# Patient Record
Sex: Female | Born: 1995 | Race: White | Hispanic: No | Marital: Married | Smoking: Never smoker
Health system: Southern US, Community
[De-identification: ages and names within clinical notes are randomized; demographics above are authoritative.]

## PROBLEM LIST (undated history)

## (undated) DIAGNOSIS — O10919 Unspecified pre-existing hypertension complicating pregnancy, unspecified trimester: Secondary | ICD-10-CM

## (undated) DIAGNOSIS — F419 Anxiety disorder, unspecified: Secondary | ICD-10-CM

## (undated) DIAGNOSIS — N83209 Unspecified ovarian cyst, unspecified side: Secondary | ICD-10-CM

## (undated) DIAGNOSIS — E282 Polycystic ovarian syndrome: Secondary | ICD-10-CM

## (undated) DIAGNOSIS — F32A Depression, unspecified: Secondary | ICD-10-CM

## (undated) DIAGNOSIS — F329 Major depressive disorder, single episode, unspecified: Secondary | ICD-10-CM

## (undated) DIAGNOSIS — I999 Unspecified disorder of circulatory system: Secondary | ICD-10-CM

## (undated) DIAGNOSIS — E7800 Pure hypercholesterolemia, unspecified: Secondary | ICD-10-CM

## (undated) HISTORY — DX: Unspecified ovarian cyst, unspecified side: N83.209

## (undated) HISTORY — DX: Depression, unspecified: F32.A

## (undated) HISTORY — PX: OTHER SURGICAL HISTORY: SHX169

## (undated) HISTORY — DX: Pure hypercholesterolemia, unspecified: E78.00

## (undated) HISTORY — DX: Polycystic ovarian syndrome: E28.2

## (undated) HISTORY — DX: Unspecified disorder of circulatory system: I99.9

## (undated) HISTORY — DX: Major depressive disorder, single episode, unspecified: F32.9

## (undated) HISTORY — DX: Anxiety disorder, unspecified: F41.9

## (undated) HISTORY — PX: TONSILLECTOMY: SHX5217

---
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
## 2015-02-22 ENCOUNTER — Other Ambulatory Visit: Payer: Self-pay

## 2015-02-22 DIAGNOSIS — E282 Polycystic ovarian syndrome: Secondary | ICD-10-CM

## 2015-02-22 NOTE — Telephone Encounter (Signed)
Pt left v/m; pt seen to establish on 12/19/2014; 12/19/14 office note said prozac refilled on 12/19/14 but do not see on med list where refilled. Pt said has been without med almost 3 months and [facility] [place]Please advise. Pt request cb.

## 2015-02-23 MED ORDER — FLUOXETINE HCL 40 MG PO CAPS: 40.0000 mg | ORAL_CAPSULE | Freq: Every day | ORAL | Status: DC

## 2015-02-23 MED ORDER — NORGESTIMATE-ETH ESTRADIOL 0.25-35 MG-MCG PO TABS: 1.0000 | ORAL_TABLET | Freq: Every day | ORAL | Status: DC

## 2015-03-07 ENCOUNTER — Telehealth: Payer: Self-pay | Admitting: Internal Medicine

## 2015-03-07 NOTE — Telephone Encounter (Signed)
Pt has appt on 03/08/15 at 1:45 pm with Nicki Reaperegina Baity NP.

## 2015-03-07 NOTE — Telephone Encounter (Signed)
Patient Name: Carmen Reid  DOB: 04/21/1996    Initial Comment Caller states she has been having headaches and feeling dizzy. Does not know her Drs name.   Nurse Assessment  Nurse: Scarlette ArStandifer, RN, Heather Date/Time (Eastern Time): 03/07/2015 2:19:39 PM  Confirm and document reason for call. If symptomatic, describe symptoms. ---Caller states that she gets headaches and dizziness when she bends over, when she stands up the headache and dizziness goes away. This started a couple months ago.  Has the patient traveled out of the country within the last 30 days? ---Not Applicable  Does the patient require triage? ---Yes  Related visit to physician within the last 2 weeks? ---No  Does the PT have any chronic conditions? (i.e. diabetes, asthma, etc.) ---Yes  List chronic conditions. ---PCOS,  Did the patient indicate they were pregnant? ---No     Guidelines    Guideline Title Affirmed Question Affirmed Notes  Dizziness [1] MILD dizziness (walking normally) AND [2] present > 3 days    Final Disposition User   See PCP When Office is Open (within 3 days) Standifer, RN, Herbert SetaHeather    Comments  Appt made tomorrow at 1:45 pm with Dr. Sampson SiBaity.   Disagree/Comply: Comply

## 2015-03-08 ENCOUNTER — Encounter: Payer: Self-pay | Admitting: Internal Medicine

## 2015-03-08 ENCOUNTER — Ambulatory Visit (INDEPENDENT_AMBULATORY_CARE_PROVIDER_SITE_OTHER): Payer: BLUE CROSS/BLUE SHIELD | Admitting: Internal Medicine

## 2015-03-08 VITALS — BP 120/84 | HR 84 | Temp 98.4°F | Wt 206.0 lb

## 2015-03-08 DIAGNOSIS — R42 Dizziness and giddiness: Secondary | ICD-10-CM

## 2015-03-08 DIAGNOSIS — H9201 Otalgia, right ear: Secondary | ICD-10-CM | POA: Diagnosis not present

## 2015-03-08 DIAGNOSIS — R51 Headache: Secondary | ICD-10-CM

## 2015-03-08 DIAGNOSIS — R519 Headache, unspecified: Secondary | ICD-10-CM

## 2015-03-08 NOTE — Progress Notes (Signed)
Subjective:    Patient ID: Carmen Reid, female    DOB: Aug 14, 1996, 19 y.o.   MRN: [id number]  HPI  Pt presents to the clinic today with c/o headache and dizziness. She reports this started a few months ago. Her symptoms are worse when she bends forward, better when she stands up. She has had some associated right ear pain and runny. She reports her ear feels achy and like there is a lot of pressure. She has not noticed decreased hearing but she does feel like she hears through a tunnel in her right ear. She denies fever, chills or body aches. She has not taken anything OTC. She has no history of allergies. She has not had sick contacts.  Review of Systems      Past Medical History  Diagnosis Date  . PCOS (polycystic ovarian syndrome)   . Ovarian cyst   . Depression   . Anxiety   . 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 High cholesterol     Not on medication    Current Outpatient Prescriptions  Medication Sig Dispense Refill  . FLUoxetine (PROZAC) 40 MG capsule Take 1 capsule (40 mg total) by mouth daily. 30 capsule 3  . metFORMIN (GLUCOPHAGE) 500 MG tablet TAKE 1 TABLET (500 MG TOTAL)  BY MOUTH 2 (TWO) TIMES DAILY WITH AMEAL. (Patient taking differently: TAKE 1 TABLET (500 MG TOTAL)  BY MOUTH  DAILY WITH AMEAL.) 60 tablet 10  . norgestimate-ethinyl estradiol (ORTHO-CYCLEN,SPRINTEC,PREVIFEM) 0.25-35 MG-MCG tablet Take 1 tablet by mouth daily. 1 Package 11   No current facility-administered medications for this visit.    Allergies  Allergen Reactions  . Warfarin And Related Dermatitis    Family History  Problem Relation Age of Onset  . Cancer Maternal Uncle     liver cancer  . Kidney disease Maternal Uncle   . Cancer Maternal Grandmother 51    breast cancer/skin cancer  . Heart disease Maternal Grandmother   . Diabetes Maternal Grandmother   . Kidney disease Maternal Grandmother   . Hyperlipidemia Maternal  Grandmother   . Hypertension Maternal Grandmother   . Arthritis Maternal Grandmother   . Mental illness Maternal Grandmother   . Heart disease Maternal Grandfather   . Cancer Maternal Grandfather 40    prostate cancer  . Diabetes Maternal Grandfather   . Hyperlipidemia Maternal Grandfather   . Mental illness Maternal Grandfather   . Mental illness Paternal Grandmother   . Hypertension Paternal Grandmother   . Hypertension Mother   . Mental illness Mother   . Arthritis Father   . Hyperlipidemia Father   . Heart disease Father   . Stroke Maternal Aunt   . Heart disease Paternal Uncle   . Cancer Maternal Uncle     Colon    History   Social History  . Marital Status: Single    Spouse Name: N/A  . Number of Children: N/A  . Years of Education: N/A   Occupational History  . Not on file.   Social History Main Topics  . Smoking status: Never Smoker   . Smokeless tobacco: Never Used  . Alcohol Use: No  . Drug Use: No  . Sexual Activity: Yes    Birth Control/ Protection: Pill   Other Topics Concern  . Not on file   Social History Narrative     Constitutional: Pt reports headache. Denies fever, malaise, fatigue, or abrupt weight changes.  HEENT: Pt reports right ear pain, runny nose. Denies eye pain, eye redness, ringing in the ears, wax buildup, nasal congestion, bloody nose, or sore throat. Respiratory: Denies difficulty breathing, shortness of breath, cough or sputum production.   Cardiovascular: Denies chest pain, chest tightness, palpitations or swelling in the hands or feet.  Skin: Denies redness, rashes, lesions or ulcercations.  Neurological: Pt reports dizziness. Denies difficulty with memory, difficulty with speech or problems with balance and coordination.   No other specific complaints in a complete review of systems (except as listed in HPI above).  Objective:   Physical Exam  BP 120/84 mmHg  Pulse 84  Temp(Src) 98.4 F (36.9 C) (Oral)  Wt 206 lb  (93.441 kg)  SpO2 99%  LMP 02/13/2015 Wt Readings from Last 3 Encounters:  03/08/15 206 lb (93.441 kg) (98 %*, Z = 2.03)  12/19/14 209 lb (94.802 kg) (98 %*, Z = 2.07)  07/01/14 199 lb (90.266 kg) (97 %*, Z = 1.95)   * Growth percentiles are based on CDC 2-20 Years data.    General: Appears her stated age, obese in NAD. Skin: Warm, dry and intact. No rashes, lesions or ulcerations noted. HEENT: Head: normal shape and size, no sinus tenderness; Eyes: sclera white, no icterus, conjunctiva pink, pupils marginally unequal R>L, and EOMs intact; Ears: Tm's gray and intact, normal light reflex; Throat/Mouth: Teeth present, mucosa pink and moist, no exudate, lesions or ulcerations noted.  Neck: No adenopathy noted. Cardiovascular: Normal rate and rhythm. S1,S2 noted.  No murmur, rubs or gallops noted.  Pulmonary/Chest: Normal effort and positive vesicular breath sounds. No respiratory distress. No wheezes, rales or ronchi noted.   Neurological: Alert and oriented. Coordination normal. Weber lateralizes to left ear. Normal Rinne.  BMET    Component Value Date/Time   NA 138 12/19/2014 1549   NA 133 06/21/2013 1436   K 4.3 12/19/2014 1549   K 3.3 06/21/2013 1436   CL 105 12/19/2014 1549   CL 100 06/21/2013 1436   CO2 24 12/19/2014 1549   CO2 24 06/21/2013 1436   GLUCOSE 87 12/19/2014 1549   GLUCOSE 108* 06/21/2013 1436   BUN 10 12/19/2014 1549   BUN 5* 06/21/2013 1436   CREATININE 0.74 12/19/2014 1549   CREATININE 0.76 06/21/2013 1436   CALCIUM 9.6 12/19/2014 1549   CALCIUM 9.8 06/21/2013 1436    Lipid Panel     Component Value Date/Time   CHOL 257* 12/19/2014 1549   TRIG 309.0* 12/19/2014 1549   HDL 44.10 12/19/2014 1549   CHOLHDL 6 12/19/2014 1549   VLDL 61.8* 12/19/2014 1549    CBC    Component Value Date/Time   WBC 7.8 12/19/2014 1549   WBC 16.7* 06/21/2013 1436   RBC 4.63 12/19/2014 1549   RBC 4.92 06/21/2013 1436   HGB 13.9 12/19/2014 1549   HGB 15.1 06/21/2013  1436   HCT 40.4 12/19/2014 1549   HCT 42.6 06/21/2013 1436   PLT 370.0 12/19/2014 1549   PLT 275 06/21/2013 1436   MCV 87.2 12/19/2014 1549   MCV 87 06/21/2013 1436   MCH 30.6 06/21/2013 1436   MCHC 34.5 12/19/2014 1549   MCHC 35.4 06/21/2013 1436   RDW 12.6 12/19/2014 1549   RDW 12.8 06/21/2013 1436    Hgb A1C Lab Results  Component Value Date   HGBA1C 5.2 12/19/2014         Assessment & Plan:   Headache, dizziness,, ear pain:  ? ETD Will start Flonase 1 spray daily  x 2-3 weeks Orthostatics, she did have a drop from sitting to standing, but asymptomatic- push more fluids She declines blood work today  RTC as needed or if symptoms persist or worsen

## 2015-03-08 NOTE — Patient Instructions (Addendum)
  Flonase 1 x day for the next 2-3 weeksBarotitis Media Barotitis media is inflammation of your middle ear. This occurs when the auditory tube (eustachian tube) leading from the back of your nose (nasopharynx) to your eardrum is blocked. This blockage may result from a cold, environmental allergies, or an upper respiratory infection. Unresolved barotitis media may lead to damage or hearing loss (barotrauma), which may become permanent. HOME CARE INSTRUCTIONS   Use medicines as recommended by your health care provider. Over-the-counter medicines will help unblock the canal and can help during times of air travel.  Do not put anything into your ears to clean or unplug them. Eardrops will not be helpful.  Do not swim, dive, or fly until your health care provider says it is all right to do so. If these activities are necessary, chewing gum with frequent, forceful swallowing may help. It is also helpful to hold your nose and gently blow to pop your ears for equalizing pressure changes. This forces air into the eustachian tube.  Only take over-the-counter or prescription medicines for pain, discomfort, or fever as directed by your health care provider.  A decongestant may be helpful in decongesting the middle ear and make pressure equalization easier. SEEK MEDICAL CARE IF:  You experience a serious form of dizziness in which you feel as if the room is spinning and you feel nauseated (vertigo).  Your symptoms only involve one ear. SEEK IMMEDIATE MEDICAL CARE IF:   You develop a severe headache, dizziness, or severe ear pain.  You have bloody or pus-like drainage from your ears.  You develop a fever.  Your problems do not improve or become worse. MAKE SURE YOU:   Understand these instructions.  Will watch your condition.  Will get help right away if you are not doing well or get worse. Document Released: 08/02/2000 Document Revised: 05/26/2013 Document Reviewed: 03/02/2013 [facility]  Patient Information 2015 [place], [place]. This information is not intended to replace advice given to you by your health care provider. Make sure you discuss any questions you have with your health care provider.

## 2015-03-08 NOTE — Progress Notes (Signed)
Pre visit review using our clinic review tool, if applicable. No additional management support is needed unless otherwise documented below in the visit note. 

## 2015-03-13 ENCOUNTER — Telehealth: Payer: Self-pay

## 2015-03-13 NOTE — Telephone Encounter (Signed)
Pt was seen 03/08/15 and nasal spray was not at pharmacy; advised pt flonase to use 1 spray daily for 2-3 weeks OTC and if not better pt will cb. Pt voiced understanding.

## 2015-03-21 ENCOUNTER — Other Ambulatory Visit: Payer: Self-pay

## 2015-03-21 MED ORDER — METFORMIN HCL 500 MG PO TABS: ORAL_TABLET | ORAL | Status: DC

## 2015-03-21 NOTE — Telephone Encounter (Signed)
Pt left v/m requesting refill metformin to [facility]. On 12/19/14 visit noted pt not taking metformin. 12/19/14 lab result noted pt does not have diabetes. Spoke with pt; pt said taking metformin for PCOS, pt said she is supposed to be taking metformin 500 mg 1 tab bid but pt is only taking one tab daily. This is reflected on med list.

## 2015-04-14 ENCOUNTER — Telehealth: Payer: Self-pay

## 2015-04-14 ENCOUNTER — Encounter: Payer: Self-pay | Admitting: Internal Medicine

## 2015-04-14 ENCOUNTER — Ambulatory Visit
Admission: RE | Admit: 2015-04-14 | Discharge: 2015-04-14 | Disposition: A | Discharge status: Home / Self Care | Payer: BLUE CROSS/BLUE SHIELD | Source: Ambulatory Visit | Attending: Internal Medicine | Admitting: Internal Medicine

## 2015-04-14 ENCOUNTER — Ambulatory Visit: Payer: Self-pay | Admitting: Internal Medicine

## 2015-04-14 ENCOUNTER — Telehealth: Payer: Self-pay | Admitting: Internal Medicine

## 2015-04-14 ENCOUNTER — Other Ambulatory Visit: Payer: Self-pay | Admitting: Internal Medicine

## 2015-04-14 ENCOUNTER — Ambulatory Visit (INDEPENDENT_AMBULATORY_CARE_PROVIDER_SITE_OTHER): Payer: BLUE CROSS/BLUE SHIELD | Admitting: Internal Medicine

## 2015-04-14 VITALS — BP 126/78 | HR 78 | Temp 98.2°F | Wt 207.0 lb

## 2015-04-14 DIAGNOSIS — R519 Headache, unspecified: Secondary | ICD-10-CM

## 2015-04-14 DIAGNOSIS — R51 Headache: Secondary | ICD-10-CM | POA: Diagnosis present

## 2015-04-14 DIAGNOSIS — G4485 Primary stabbing headache: Secondary | ICD-10-CM

## 2015-04-14 MED ORDER — IOHEXOL 350 MG/ML SOLN
80.0000 mL | Freq: Once | INTRAVENOUS | Status: AC | PRN
  Administered 2015-04-14: 80 mL via INTRAVENOUS

## 2015-04-14 NOTE — Telephone Encounter (Signed)
Pt has appt with Nicki Reaper NP 04/14/15 at 2:15pm.

## 2015-04-14 NOTE — Telephone Encounter (Signed)
Patient Name: Carmen Reid  DOB: 16-Jun-1996    Initial Comment Caller states she is having sharp pains in her head and blurred vision. Doctor unknown.   Nurse Assessment  Nurse: Sherilyn Cooter, RN, Thurmond Butts Date/Time Lamount Cohen Time): 04/14/2015 1:12:15 PM  Confirm and document reason for call. If symptomatic, describe symptoms. ---Caller states that she has sharp pains in her head with blurry vision which began about 2 weeks ago. She rates her pain as 5 on 0-10 scale currently. Denies the blurry vision at present. It only happens when she has a sharp pain. Denies vomiting. She states that when she bends over, her vision occasionally go black on her. Denies weakness and numbness.  Has the patient traveled out of the country within the last 30 days? ---No  Does the patient require triage? ---Yes  Related visit to physician within the last 2 weeks? ---No  Does the PT have any chronic conditions? (i.e. diabetes, asthma, etc.) ---Yes  List chronic conditions. ---Hypercholesterolemia  Did the patient indicate they were pregnant? ---No     Guidelines    Guideline Title Affirmed Question Affirmed Notes  Headache [1] MODERATE headache (e.g., interferes with normal activities) AND [2] present > 24 hours AND [3] unexplained (Exceptions: analgesics not tried, typical migraine, or headache part of viral illness)    Final Disposition User   See Physician within 24 Hours Sherilyn Cooter, RN, Thurmond Butts    Comments  Appointment scheduled for today at 3:00pm with Nicki Reaper NP   Disagree/Comply: Danella Maiers

## 2015-04-14 NOTE — Telephone Encounter (Signed)
Per verbal order---from Dewitt Rota for pt to go home and CT scan was not urgent---will be referring her out and listen out for call

## 2015-04-14 NOTE — Progress Notes (Signed)
Pre visit review using our clinic review tool, if applicable. No additional management support is needed unless otherwise documented below in the visit note. 

## 2015-04-14 NOTE — Patient Instructions (Signed)

## 2015-04-14 NOTE — Progress Notes (Signed)
Subjective:    Patient ID: Carmen Reid, female    DOB: [birth year], 19 y.o.   MRN: [id number]  HPI  Pt presents to the clinic today with c/o headaches and right ear pain. She reports this hasbeen intermittent over the last 2 weeks. She will get a sharp, stabbing pain in her right temple or back of right head that is associated with blurry vision. It comes and goes and last only about 1 minute. She also reports when she bends forward, everything "goes black". She denies nausea, vomiting, dizziness, chest pain, shortness of breath or numbness and tingling. Nothing makes them better or worse. She was seen for the same in 02/2015. She was started on Flonase for possible ETD. The Flonase did not help. She denies any changes in diet, medication or activity level. She has not had a eye exam in > 1 year. She tells me that her mom had a brain tumor and her aunt had a a brain aneurysm.  Review of Systems      Past Medical History  Diagnosis Date  . PCOS (polycystic ovarian syndrome)   . Ovarian cyst   . Depression   . Anxiety   . 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 High cholesterol     Not on medication    Current Outpatient Prescriptions  Medication Sig Dispense Refill  . FLUoxetine (PROZAC) 40 MG capsule Take 1 capsule (40 mg total) by mouth daily. 30 capsule 3  . metFORMIN (GLUCOPHAGE) 500 MG tablet TAKE 1 TABLET (500 MG TOTAL)  BY MOUTH  DAILY WITH AMEAL. 30 tablet 5  . norgestimate-ethinyl estradiol (ORTHO-CYCLEN,SPRINTEC,PREVIFEM) 0.25-35 MG-MCG tablet Take 1 tablet by mouth daily. 1 Package 11   No current facility-administered medications for this visit.    Allergies  Allergen Reactions  . Warfarin And Related Dermatitis    Family History  Problem Relation Age of Onset  . Cancer Maternal Uncle     liver cancer  . Kidney disease Maternal Uncle   . Cancer Maternal Grandmother 52    breast cancer/skin cancer  . Heart  disease Maternal Grandmother   . Diabetes Maternal Grandmother   . Kidney disease Maternal Grandmother   . Hyperlipidemia Maternal Grandmother   . Hypertension Maternal Grandmother   . Arthritis Maternal Grandmother   . Mental illness Maternal Grandmother   . Heart disease Maternal Grandfather   . Cancer Maternal Grandfather 68    prostate cancer  . Diabetes Maternal Grandfather   . Hyperlipidemia Maternal Grandfather   . Mental illness Maternal Grandfather   . Mental illness Paternal Grandmother   . Hypertension Paternal Grandmother   . Hypertension Mother   . Mental illness Mother   . Arthritis Father   . Hyperlipidemia Father   . Heart disease Father   . Stroke Maternal Aunt   . Heart disease Paternal Uncle   . Cancer Maternal Uncle     Colon    Social History   Social History  . Marital Status: Single    Spouse Name: N/A  . Number of Children: N/A  . Years of Education: N/A   Occupational History  . Not on file.   Social History Main Topics  . Smoking status: Never Smoker   . Smokeless tobacco: Never Used  . Alcohol Use: No  . Drug Use: No  . Sexual Activity: Yes    Birth Control/ Protection: Pill   Other Topics  Concern  . Not on file   Social History Narrative     Constitutional: Pt reports headache. Denies fever, malaise, fatigue, or abrupt weight changes.  HEENT: Denies eye pain, eye redness, ear pain, ringing in the ears, wax buildup, runny nose, nasal congestion, bloody nose, or sore throat. Respiratory: Denies difficulty breathing, shortness of breath, cough or sputum production.   Cardiovascular: Denies chest pain, chest tightness, palpitations or swelling in the hands or feet.  Neurological: Denies dizziness, difficulty with memory, difficulty with speech or problems with balance and coordination.   No other specific complaints in a complete review of systems (except as listed in HPI above).  Objective:   Physical Exam   BP 126/78 mmHg   Pulse 78  Temp(Src) 98.2 F (36.8 C) (Oral)  Wt 207 lb (93.895 kg)  SpO2 98%  LMP 04/12/2015 Wt Readings from Last 3 Encounters:  04/14/15 207 lb (93.895 kg) (98 %*, Z = 2.04)  03/08/15 206 lb (93.441 kg) (98 %*, Z = 2.03)  12/19/14 209 lb (94.802 kg) (98 %*, Z = 2.07)   * Growth percentiles are based on CDC 2-20 Years data.    General: Appears her stated age, obese in NAD. HEENT: Head: normal shape and size; Eyes: sclera white, no icterus, conjunctiva pink, PERRLA and EOMs intact;  Cardiovascular: Normal rate and rhythm. S1,S2 noted.  No murmur, rubs or gallops noted.  Pulmonary/Chest: Normal effort and positive vesicular breath sounds. No respiratory distress. No wheezes, rales or ronchi noted.  Neurological: Alert and oriented. Coordination normal.    BMET    Component Value Date/Time   NA 138 12/19/2014 1549   NA 133 06/21/2013 1436   K 4.3 12/19/2014 1549   K 3.3 06/21/2013 1436   CL 105 12/19/2014 1549   CL 100 06/21/2013 1436   CO2 24 12/19/2014 1549   CO2 24 06/21/2013 1436   GLUCOSE 87 12/19/2014 1549   GLUCOSE 108* 06/21/2013 1436   BUN 10 12/19/2014 1549   BUN 5* 06/21/2013 1436   CREATININE 0.74 12/19/2014 1549   CREATININE 0.76 06/21/2013 1436   CALCIUM 9.6 12/19/2014 1549   CALCIUM 9.8 06/21/2013 1436    Lipid Panel     Component Value Date/Time   CHOL 257* 12/19/2014 1549   TRIG 309.0* 12/19/2014 1549   HDL 44.10 12/19/2014 1549   CHOLHDL 6 12/19/2014 1549   VLDL 61.8* 12/19/2014 1549    CBC    Component Value Date/Time   WBC 7.8 12/19/2014 1549   WBC 16.7* 06/21/2013 1436   RBC 4.63 12/19/2014 1549   RBC 4.92 06/21/2013 1436   HGB 13.9 12/19/2014 1549   HGB 15.1 06/21/2013 1436   HCT 40.4 12/19/2014 1549   HCT 42.6 06/21/2013 1436   PLT 370.0 12/19/2014 1549   PLT 275 06/21/2013 1436   MCV 87.2 12/19/2014 1549   MCV 87 06/21/2013 1436   MCH 30.6 06/21/2013 1436   MCHC 34.5 12/19/2014 1549   MCHC 35.4 06/21/2013 1436   RDW 12.6  12/19/2014 1549   RDW 12.8 06/21/2013 1436    Hgb A1C Lab Results  Component Value Date   HGBA1C 5.2 12/19/2014        Assessment & Plan:   Headache:  Will obtain CT angio head to r/o aneurysm If CT head normal, these headaches are likely benign Advised her at some point she should get her eyes checked She had labs 12/2014- reviewed  Will follow up after CT head, to ER if worse

## 2015-04-15 ENCOUNTER — Telehealth: Payer: Self-pay | Admitting: Family Medicine

## 2015-04-15 NOTE — Telephone Encounter (Signed)
On call note.  Patient was worried about CT.  I asked RN fielding the call to notify pt that nothing was emergent/urgent on the CT.  It appears she has referral to neuro because of the headaches, not because of a sig abnormality on the CT.  She has incidental findings on CT, will ask Baity to talk to patient about that.  Routed to [place].

## 2015-04-17 ENCOUNTER — Telehealth: Payer: Self-pay | Admitting: *Deleted

## 2015-04-17 ENCOUNTER — Telehealth: Payer: Self-pay

## 2015-04-17 NOTE — Telephone Encounter (Signed)
PLEASE NOTE: All timestamps contained within this report are represented as [place] Standard Time. CONFIDENTIALTY NOTICE: This fax transmission is intended only for the addressee. It contains information that is legally privileged, confidential or otherwise protected from use or disclosure. If you are not the intended recipient, you are strictly prohibited from reviewing, disclosing, copying using or disseminating any of this information or taking any action in reliance on or regarding this information. If you have received this fax in error, please notify us immediately by telephone so that we can arrange for its return to [place]. Phone: [phone], Toll-Free: [phone], Fax: [phone] Page: 1 of 2 Call Id: [id number] [facility] Night - Client TELEPHONE ADVICE RECORD [facility] Patient Name: Carmen Reid Gender: Female DOB: [birth year] Age: 19 Y 6 M 14 D Return Phone Number: [phone] (Primary) Address: City/State/Zip: [place] Client [facility] Night - Client Client Site [facility] [place] - Night Physician Nicki Reaper Contact Type Call Call Type Triage / Clinical Relationship To Patient Self Return Phone Number [phone] (Primary) Chief Complaint Lab Result Initial Comment Would like lab results. Nurse Assessment Nurse: Izola Price, RN, Cala Bradford Date/Time Lamount Cohen Time): 04/15/2015 11:24:02 AM Confirm and document reason for call. If symptomatic, describe symptoms. ---caller states she Would like lab results. CT scan yesterday at hospital. Sharp pain, dizziness, headaches for 5 days. Light makes headaches worse. Pt states that nurse called yesterday and left a message that it wasn't an emergency but pt does need to call the office and talk with Dr. as soon as she can. Pt still having symptoms and is very worried. She would like nurse to call MD to see if they can pull up the results. Has the patient  traveled out of the country within the last 30 days? ---Not Applicable Does the patient require triage? ---Declined Triage Please document clinical information provided and list any resource used. ---Nurse will page MD on call to see if any information can be retrieved. Guidelines Guideline Title Affirmed Question Affirmed Notes Nurse Date/Time (Eastern Time) Disp. Time Lamount Cohen Time) Disposition Final User 04/15/2015 11:08:29 AM Attempt made - message left Rayburn Felt 04/15/2015 11:32:45 AM Called On-Call Provider Izola Price, RN, [facility] 04/15/2015 11:40:02 AM Clinical Call Izola Price, RN, [facility] 04/15/2015 11:41:06 AM Send To RN Personal Izola Price, RN, [facility] Bradford 04/15/2015 1:39:50 PM Clinical Call Yes Izola Price, RN, Cal[facility] Bradford After Care Instructions Given Call Event Type User Date / Time Description Comments User: Karen Chafe, RN Date/Time Lamount Cohen Time): 04/15/2015 1:39:36 PM PLEASE NOTE: All timestamps contained within this report are represented as [place] Standard Time. CONFIDENTIALTY NOTICE: This fax transmission is intended only for the addressee. It contains information that is legally privileged, confidential or otherwise protected from use or disclosure. If you are not the intended recipient, you are strictly prohibited from reviewing, disclosing, copying using or disseminating any of this information or taking any action in reliance on or regarding this information. If you have received this fax in error, please notify us immediately by telephone so that we can arrange for its return to [place]. Phone: [phone], Toll-Free: [phone], Fax: [phone] Page: 2 of 2 Call Id: [id number] Comments Nurse had called and left message for pt to call office when she got a break. Pt returned call and nurse explained that the Dr. stated that the CT looked ok as far as anything that could be causing the headaches. No tumors or anything like that but that pt did need to call the office  on Monday to  see if they could get an appt set up for pt to see a neurologist since the Ct didn't show any reason for the headaches. Pt got very upset stating that the person who called yesterday and left a message shouldn't have said that everything was ok but for pt to call office as soon as she can. this made pt feels as if something was wrong. Nurse explained to her that the nurse (in the office) probably meant that she needed to call in to see if an appt could be scheduled for the neurologist. Pt was just very upset about the way that the office nurse stated the message and that this is what made her worry so much that something was wrong. Nurse ([facility]) tried to reassure pt that the CT as far as the cause of the headaches, was ok and non emergent and that the nurse in the office was just trying to make sure that she called in sooner rather than later to get an appt scheduled for neuro. so they could try to find out what was causing the headaches. Pt started yelling that she shouldn't have said what she did and hung up on nurse. Paging DoctorName Phone DateTime Result/Outcome Message Type Notes Raechel Ache [phone] 04/15/2015 11:32:45 AM Called On Call Provider - Reached Doctor Paged Raechel Ache 04/15/2015 11:39:45 AM Spoke with On Call - General Message Result Dr. Para March got results of CT scan (nothing emergent but since nothing was found to be causing the headaches, pt needs to see neurologist). Dr. Para March will inform pt Dr. (Dr. Sampson Si) that pt would like a call from her on Monday to discuss this as she is a little worried. Nurse notified pt to call in when on break (pt is at work) to discuss results.

## 2015-04-17 NOTE — Telephone Encounter (Signed)
Mel-  Call pt. I sent her a mychart message over the weekend about her CT but I guess she didn't get it. Her head CT did not show anything acute, no tumor or aneurysm but I still want to refer her to neurology for evaluation of her ongoing headaches. I have put in the referral and someone should call her with an appt.

## 2015-04-17 NOTE — Telephone Encounter (Signed)
There is a result note about this

## 2015-04-17 NOTE — Telephone Encounter (Signed)
PLEASE NOTE: All timestamps contained within this report are represented as [place] Standard Time. CONFIDENTIALTY NOTICE: This fax transmission is intended only for the addressee. It contains information that is legally privileged, confidential or otherwise protected from use or disclosure. If you are not the intended recipient, you are strictly prohibited from reviewing, disclosing, copying using or disseminating any of this information or taking any action in reliance on or regarding this information. If you have received this fax in error, please notify us immediately by telephone so that we can arrange for its return to [place]. Phone: [phone], Toll-Free: [phone], Fax: [phone] Page: 1 of 2 Call Id: [id number] [facility] Night - Client TELEPHONE ADVICE RECORD [facility] Medical Call Center Patient Name: Carmen Reid Gender: Female DOB: Feb 01, 1996 Age: 19 Y 6 M 14 D Return Phone Number: [phone] (Primary) Address: City/State/Zip: [place] Client [facility] Night - Client Client Site [facility] [place] - Night Physician Nicki Reaper Contact Type Call Call Type Triage / Clinical Relationship To Patient Self Return Phone Number [phone] (Primary) Chief Complaint Lab Result Initial Comment Would like lab results. Nurse Assessment Nurse: Izola Price, RN, Cala Bradford Date/Time Lamount Cohen Time): 04/15/2015 11:24:02 AM Confirm and document reason for call. If symptomatic, describe symptoms. ---caller states she Would like lab results. CT scan yesterday at hospital. Sharp pain, dizziness, headaches for 5 days. Light makes headaches worse. Pt states that nurse called yesterday and left a message that it wasn't an emergency but pt does need to call the office and talk with Dr. as soon as she can. Pt still having symptoms and is very worried. She would like nurse to call MD to see if they can pull up the results. Has the patient  traveled out of the country within the last 30 days? ---Not Applicable Does the patient require triage? ---Declined Triage Please document clinical information provided and list any resource used. ---Nurse will page MD on call to see if any information can be retrieved. Guidelines Guideline Title Affirmed Question Affirmed Notes Nurse Date/Time (Eastern Time) Disp. Time Lamount Cohen Time) Disposition Final User 04/15/2015 11:08:29 AM Attempt made - message left Rayburn Felt 04/15/2015 11:32:45 AM Called On-Call Provider Izola Price, RN, Cala Bradford 04/15/2015 11:40:02 AM Clinical Call Yes Izola Price, RN, Cala Bradford After Care Instructions Given Call Event Type User Date / Time Description PLEASE NOTE: All timestamps contained within this report are represented as [place] Standard Time. CONFIDENTIALTY NOTICE: This fax transmission is intended only for the addressee. It contains information that is legally privileged, confidential or otherwise protected from use or disclosure. If you are not the intended recipient, you are strictly prohibited from reviewing, disclosing, copying using or disseminating any of this information or taking any action in reliance on or regarding this information. If you have received this fax in error, please notify us immediately by telephone so that we can arrange for its return to [place]. Phone: [phone], Toll-Free: [phone], Fax: [phone] Page: 2 of 2 Call Id: [id number] Paging DoctorName Phone DateTime Result/Outcome Message Type Notes Raechel Ache [id number] 04/15/2015 11:32:45 AM Called On Call Provider - Reached Doctor Paged Raechel Ache 04/15/2015 11:39:45 AM Spoke with On Call - General Message Result Dr. Para March got results of CT scan (nothing emergent but since nothing was found to be causing the headaches, pt needs to see neurologist). Dr. Para March will inform pt Dr. (Dr. Sampson Si) that pt would like a call from her on Monday to discuss this as she is  a little  worried. Nurse notified pt to call in when on break (pt is at work) to discuss results.

## 2015-04-17 NOTE — Telephone Encounter (Signed)
Yes, Ibuprofen or Tylenol is fine.

## 2015-04-17 NOTE — Telephone Encounter (Signed)
Patient left a voicemail stating that she wants to know if she can take ibuprofen for her headaches? Patient stated that she will be at work and it is okay to leave a voicemail.

## 2015-04-17 NOTE — Telephone Encounter (Signed)
Pt is aware as instructed 

## 2015-04-17 NOTE — Telephone Encounter (Signed)
lmovm

## 2015-04-17 NOTE — Telephone Encounter (Signed)
See 04/15/15 phone note.

## 2015-05-05 ENCOUNTER — Other Ambulatory Visit: Payer: Self-pay | Admitting: Neurology

## 2015-05-05 DIAGNOSIS — R519 Headache, unspecified: Secondary | ICD-10-CM

## 2015-05-05 DIAGNOSIS — R51 Headache: Principal | ICD-10-CM

## 2015-05-05 DIAGNOSIS — R930 Abnormal findings on diagnostic imaging of skull and head, not elsewhere classified: Secondary | ICD-10-CM

## 2015-05-15 ENCOUNTER — Ambulatory Visit
Admission: RE | Admit: 2015-05-15 | Discharge: 2015-05-15 | Disposition: A | Discharge status: Home / Self Care | Payer: BLUE CROSS/BLUE SHIELD | Source: Ambulatory Visit | Attending: Neurology | Admitting: Neurology

## 2015-05-15 ENCOUNTER — Other Ambulatory Visit: Payer: Self-pay | Admitting: Neurology

## 2015-05-15 DIAGNOSIS — R930 Abnormal findings on diagnostic imaging of skull and head, not elsewhere classified: Secondary | ICD-10-CM

## 2015-05-15 DIAGNOSIS — R519 Headache, unspecified: Secondary | ICD-10-CM

## 2015-05-15 DIAGNOSIS — G932 Benign intracranial hypertension: Secondary | ICD-10-CM

## 2015-05-15 DIAGNOSIS — R51 Headache: Principal | ICD-10-CM

## 2015-05-15 MED ORDER — GADOBENATE DIMEGLUMINE 529 MG/ML IV SOLN
19.0000 mL | Freq: Once | INTRAVENOUS | Status: AC | PRN
  Administered 2015-05-15: 19 mL via INTRAVENOUS

## 2015-05-18 ENCOUNTER — Ambulatory Visit
Admission: RE | Admit: 2015-05-18 | Discharge: 2015-05-18 | Disposition: A | Discharge status: Home / Self Care | Payer: BLUE CROSS/BLUE SHIELD | Source: Ambulatory Visit | Attending: Neurology | Admitting: Neurology

## 2015-05-18 ENCOUNTER — Telehealth: Payer: Self-pay

## 2015-05-18 DIAGNOSIS — G932 Benign intracranial hypertension: Secondary | ICD-10-CM | POA: Insufficient documentation

## 2015-05-18 LAB — CBC
HCT: 36 % (ref 35.0–47.0)
Hemoglobin: 12 g/dL (ref 12.0–16.0)
MCH: 30 pg (ref 26.0–34.0)
MCHC: 33.3 g/dL (ref 32.0–36.0)
MCV: 90 fL (ref 80.0–100.0)
PLATELETS: 256 10*3/uL (ref 150–440)
RBC: 4 MIL/uL (ref 3.80–5.20)
RDW: 14.9 % — AB (ref 11.5–14.5)
WBC: 7 10*3/uL (ref 3.6–11.0)

## 2015-05-18 LAB — APTT: aPTT: 29 seconds (ref 24–36)

## 2015-05-18 LAB — HCG, QUANTITATIVE, PREGNANCY: hCG, Beta Chain, Quant, S: 1 m[IU]/mL (ref <5)

## 2015-05-18 LAB — PROTIME-INR
INR: 0.95
PROTHROMBIN TIME: 12.9 s (ref 11.4–15.0)

## 2015-05-18 MED ORDER — ONDANSETRON 4 MG PO TBDP
ORAL_TABLET | ORAL | Status: AC
  Filled 2015-05-18: qty 1

## 2015-05-18 MED ORDER — ONDANSETRON 8 MG PO TBDP
4.0000 mg | ORAL_TABLET | Freq: Once | ORAL | Status: AC
  Administered 2015-05-18: 4 mg via ORAL
  Filled 2015-05-18: qty 0.5

## 2015-05-18 MED ORDER — SODIUM CHLORIDE 0.9 % IV BOLUS (SEPSIS)
1000.0000 mL | Freq: Once | INTRAVENOUS | Status: AC
  Administered 2015-05-18: 1000 mL via INTRAVENOUS

## 2015-05-18 NOTE — Progress Notes (Signed)
Patient starting to get dressed to go home. Denies pain/headache.  Became pale/diaphoretic/nauseated with emesis.  Laid back down with head flat.  BP 118/74.  Dr. Allena Katz notified. Will to continue to monitor patient.

## 2015-05-18 NOTE — Progress Notes (Signed)
Patient continues to become pale/diaphoretic and nauseated upon standing.  BP 97/61. Dr. Allena Katz notified. Pt. Laid back down and zofran per order.

## 2015-05-18 NOTE — Telephone Encounter (Signed)
PLEASE NOTE: All timestamps contained within this report are represented as [place] Standard Time. CONFIDENTIALTY NOTICE: This fax transmission is intended only for the addressee. It contains information that is legally privileged, confidential or otherwise protected from use or disclosure. If you are not the intended recipient, you are strictly prohibited from reviewing, disclosing, copying using or disseminating any of this information or taking any action in reliance on or regarding this information. If you have received this fax in error, please notify us immediately by telephone so that we can arrange for its return to [place]. Phone: [phone], Toll-Free: [phone], Fax: [phone] Page: 1 of 1 Call Id: [id number] [facility] Day - Client TELEPHONE ADVICE RECORD [facility] Medical Call Center Patient Name: Carmen Reid Gender: Female DOB: 12-29-1995 Age: 49 Y 7 M 16 D Return Phone Number: [phone] (Primary), [phone] (Secondary) Address: [place] [address] Client [facility] Day - Client Client Site [facility] [place] - Day Physician Nicki Reaper Contact Type Call Caller Name Kevionna Heffler Caller Phone Number [phone] Relationship To Patient Father Is this call to report lab results? No Call Type General Information Initial Comment Caller states, dtr is having a lumbar puncture at [facility]. During her recovery, her blood pressure has dropped 3 times. She is dizzy and fainting. He wants the Dr to know this has happened. He doesn't want to speak w/ a triage nurse, just let the office know what is happening. General Information Type Message Only Nurse Assessment Guidelines Guideline Title Affirmed Question Affirmed Notes Nurse Date/Time (Eastern Time) Disp. Time Lamount Cohen Time) Disposition Final User 05/18/2015 2:36:53 PM General Information Provided Yes Delana Meyer After Care  Instructions Given Call Event Type User Date / Time Description

## 2015-05-18 NOTE — Telephone Encounter (Signed)
Spoke with pt; pt is feeling a lot better now; pt said they gave her extra fluids at the hospital and symptoms went away. Pt will cb if needed.

## 2015-05-18 NOTE — Telephone Encounter (Signed)
noted 

## 2015-05-31 ENCOUNTER — Ambulatory Visit: Payer: BLUE CROSS/BLUE SHIELD | Admitting: Neurology

## 2015-11-09 ENCOUNTER — Ambulatory Visit (INDEPENDENT_AMBULATORY_CARE_PROVIDER_SITE_OTHER): Payer: BLUE CROSS/BLUE SHIELD | Admitting: Internal Medicine

## 2015-11-09 ENCOUNTER — Encounter: Payer: Self-pay | Admitting: Internal Medicine

## 2015-11-09 VITALS — BP 114/76 | HR 108 | Temp 98.2°F | Wt 205.0 lb

## 2015-11-09 DIAGNOSIS — F32A Depression, unspecified: Secondary | ICD-10-CM

## 2015-11-09 DIAGNOSIS — E785 Hyperlipidemia, unspecified: Secondary | ICD-10-CM

## 2015-11-09 DIAGNOSIS — F418 Other specified anxiety disorders: Secondary | ICD-10-CM | POA: Diagnosis not present

## 2015-11-09 DIAGNOSIS — F419 Anxiety disorder, unspecified: Secondary | ICD-10-CM

## 2015-11-09 DIAGNOSIS — G932 Benign intracranial hypertension: Secondary | ICD-10-CM | POA: Diagnosis not present

## 2015-11-09 DIAGNOSIS — E282 Polycystic ovarian syndrome: Secondary | ICD-10-CM

## 2015-11-09 DIAGNOSIS — F329 Major depressive disorder, single episode, unspecified: Secondary | ICD-10-CM

## 2015-11-09 LAB — CBC
HEMATOCRIT: 41.4 % (ref 36.0–46.0)
HEMOGLOBIN: 14.1 g/dL (ref 12.0–15.0)
MCHC: 34 g/dL (ref 30.0–36.0)
MCV: 86.9 fl (ref 78.0–100.0)
Platelets: 322 10*3/uL (ref 150.0–400.0)
RBC: 4.76 Mil/uL (ref 3.87–5.11)
RDW: 13.4 % (ref 11.5–14.6)
WBC: 10.2 10*3/uL (ref 4.5–10.5)

## 2015-11-09 LAB — COMPREHENSIVE METABOLIC PANEL
ALK PHOS: 49 U/L (ref 39–117)
ALT: 19 U/L (ref 0–35)
AST: 19 U/L (ref 0–37)
Albumin: 4.3 g/dL (ref 3.5–5.2)
BUN: 16 mg/dL (ref 6–23)
CO2: 23 mEq/L (ref 19–32)
Calcium: 9.7 mg/dL (ref 8.4–10.5)
Chloride: 107 mEq/L (ref 96–112)
Creatinine, Ser: 0.8 mg/dL (ref 0.40–1.20)
GFR: 97.09 mL/min (ref >60.00)
GLUCOSE: 95 mg/dL (ref 70–99)
POTASSIUM: 3.7 meq/L (ref 3.5–5.1)
Sodium: 140 mEq/L (ref 135–145)
TOTAL PROTEIN: 7.4 g/dL (ref 6.0–8.3)
Total Bilirubin: 0.3 mg/dL (ref 0.2–1.2)

## 2015-11-09 LAB — LIPID PANEL
CHOL/HDL RATIO: 5
CHOLESTEROL: 248 mg/dL — AB (ref 0–200)
HDL: 50.5 mg/dL (ref >39.00)
Triglycerides: 437 mg/dL — ABNORMAL HIGH (ref 0.0–149.0)

## 2015-11-09 LAB — LDL CHOLESTEROL, DIRECT: Direct LDL: 138 mg/dL

## 2015-11-09 LAB — HEMOGLOBIN A1C: Hgb A1c MFr Bld: 5.4 % (ref 4.6–6.5)

## 2015-11-09 MED ORDER — BUPROPION HCL ER (XL) 150 MG PO TB24: 150.0000 mg | ORAL_TABLET | Freq: Every day | ORAL | Status: DC

## 2015-11-09 NOTE — Progress Notes (Signed)
HPI  Pt presents to the clinic today to follow up chronic conditions.  PCOS: She is supposed to be taking Metformin twice daily but reports she only takes it 1 time a week, because it causes her to have an upset stomach. Periods are now regular as long as she takes her OCP. She is being followed by GYN.  Anxiety and Depression: She reports she has started getting anxious more often. It seems worse when someone raises their voice- she starts to feel like her heart is racing. She stopped taking Prozac because she felt like it wasn't working with the anxiety and she does not feel depressed. She has seen a psychiatrist in the past, but had to stop after she changed insurance companies. She has had SI in the past but none recently. She denies suicide attempts. She denies HI.  HLD: Her last LDL was 162.0. She does not take any medication for this. She reports she does not consume a low fat diet.  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 She is taking Topamax daily. She is due to see her eye doctor to evaluate the pressure in her eyes. She is due for a spinal tap now.  Past Medical History  Diagnosis Date  . PCOS (polycystic ovarian syndrome)   . Ovarian cyst   . Depression   . Anxiety   . 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 High cholesterol     Not on medication    Current Outpatient Prescriptions  Medication Sig Dispense Refill  . FLUoxetine (PROZAC) 40 MG capsule Take 1 capsule (40 mg total) by mouth daily. 30 capsule 3  . metFORMIN (GLUCOPHAGE) 500 MG tablet TAKE 1 TABLET (500 MG TOTAL)  BY MOUTH  DAILY WITH AMEAL. 30 tablet 5  . norgestimate-ethinyl estradiol (ORTHO-CYCLEN,SPRINTEC,PREVIFEM) 0.25-35 MG-MCG tablet Take 1 tablet by mouth daily. 1 Package 11   No current facility-administered medications for this visit.    Allergies  Allergen Reactions  . Latex Swelling  . Warfarin And Related Dermatitis    Family History  Problem  Relation Age of Onset  . Cancer Maternal Uncle     liver cancer  . Kidney disease Maternal Uncle   . Cancer Maternal Grandmother 43    breast cancer/skin cancer  . Heart disease Maternal Grandmother   . Diabetes Maternal Grandmother   . Kidney disease Maternal Grandmother   . Hyperlipidemia Maternal Grandmother   . Hypertension Maternal Grandmother   . Arthritis Maternal Grandmother   . Mental illness Maternal Grandmother   . Heart disease Maternal Grandfather   . Cancer Maternal Grandfather 89    prostate cancer  . Diabetes Maternal Grandfather   . Hyperlipidemia Maternal Grandfather   . Mental illness Maternal Grandfather   . Mental illness Paternal Grandmother   . Hypertension Paternal Grandmother   . Hypertension Mother   . Mental illness Mother   . Arthritis Father   . Hyperlipidemia Father   . Heart disease Father   . Stroke Maternal Aunt   . Heart disease Paternal Uncle   . Cancer Maternal Uncle     Colon    Social History   Social History  . Marital Status: Single    Spouse Name: N/A  . Number of Children: N/A  . Years of Education: N/A   Occupational History  . Not on file.   Social History Main Topics  . Smoking status: Never Smoker   . Smokeless  tobacco: Never Used  . Alcohol Use: No  . Drug Use: No  . Sexual Activity: Yes    Birth Control/ Protection: Pill   Other Topics Concern  . Not on file   Social History Narrative    ROS:  Constitutional: Pt reports fatigue. Denies fever, malaise,  headache or abrupt weight changes.  Respiratory: Denies difficulty breathing, shortness of breath, cough or sputum production.   Cardiovascular: Denies chest pain, chest tightness, palpitations or swelling in the hands or feet.  Gastrointestinal: Denies abdominal pain, bloating, constipation, diarrhea or blood in the stool.  Skin: Denies redness, rashes, lesions or ulcercations.  Neurological: Denies dizziness, difficulty with memory, difficulty with speech  or problems with balance and coordination.  Psych: Pt reports worsening anxiety and history of depression. Denies SI/HI.  No other specific complaints in a complete review of systems (except as listed in HPI above).  PE:  BP 114/76 mmHg  Pulse 108  Temp(Src) 98.2 F (36.8 C) (Oral)  Wt 205 lb (92.987 kg)  SpO2 98%  LMP 10/24/2015  Wt Readings from Last 3 Encounters:  05/18/15 210 lb (95.255 kg) (98 %*, Z = 2.08)  04/14/15 207 lb (93.895 kg) (98 %*, Z = 2.04)  03/08/15 206 lb (93.441 kg) (98 %*, Z = 2.03)   * Growth percentiles are based on CDC 2-20 Years data.    General: Appears her stated age, obese in NAD. Skin: Warm, dry and intact. Cardiovascular: Tachycardic with normal rhythm. S1,S2 noted.  No murmur, rubs or gallops noted. Pulmonary/Chest: Normal effort and positive vesicular breath sounds. No respiratory distress. No wheezes, rales or ronchi noted.  Neurological: Alert and oriented.  Psychiatric: Mood and affect mildly normal. Behavior is normal. Judgment and thought content normal.     BMET    Component Value Date/Time   NA 138 12/19/2014 1549   NA 133 06/21/2013 1436   K 4.3 12/19/2014 1549   K 3.3 06/21/2013 1436   CL 105 12/19/2014 1549   CL 100 06/21/2013 1436   CO2 24 12/19/2014 1549   CO2 24 06/21/2013 1436   GLUCOSE 87 12/19/2014 1549   GLUCOSE 108* 06/21/2013 1436   BUN 10 12/19/2014 1549   BUN 5* 06/21/2013 1436   CREATININE 0.74 12/19/2014 1549   CREATININE 0.76 06/21/2013 1436   CALCIUM 9.6 12/19/2014 1549   CALCIUM 9.8 06/21/2013 1436    Lipid Panel     Component Value Date/Time   CHOL 257* 12/19/2014 1549   TRIG 309.0* 12/19/2014 1549   HDL 44.10 12/19/2014 1549   CHOLHDL 6 12/19/2014 1549   VLDL 61.8* 12/19/2014 1549    CBC    Component Value Date/Time   WBC 7.0 05/18/2015 0853   WBC 16.7* 06/21/2013 1436   RBC 4.00 05/18/2015 0853   RBC 4.92 06/21/2013 1436   HGB 12.0 05/18/2015 0853   HGB 15.1 06/21/2013 1436   HCT 36.0  05/18/2015 0853   HCT 42.6 06/21/2013 1436   PLT 256 05/18/2015 0853   PLT 275 06/21/2013 1436   MCV 90.0 05/18/2015 0853   MCV 87 06/21/2013 1436   MCH 30.0 05/18/2015 0853   MCH 30.6 06/21/2013 1436   MCHC 33.3 05/18/2015 0853   MCHC 35.4 06/21/2013 1436   RDW 14.9* 05/18/2015 0853   RDW 12.8 06/21/2013 1436    Hgb A1C Lab Results  Component Value Date   HGBA1C 5.2 12/19/2014     Assessment and Plan:   RTC in 6 weeks to follow up  anxiety

## 2015-11-09 NOTE — Assessment & Plan Note (Signed)
Deteriorated Support offered today Will try Wellbutrin She will follow up with me in 6 weeks

## 2015-11-09 NOTE — Assessment & Plan Note (Signed)
Will check CBC, CMET and Lipid Profile today If LDL > 130, discussed starting low dose Lipitor Handout given on low fat diet

## 2015-11-09 NOTE — Patient Instructions (Signed)

## 2015-11-09 NOTE — Progress Notes (Signed)
Pre visit review using our clinic review tool, if applicable. No additional management support is needed unless otherwise documented below in the visit note. 

## 2015-11-09 NOTE — Assessment & Plan Note (Signed)
She will continue to follow with Dr. Malvin JohnsPotter Continue Topamax daily

## 2015-11-09 NOTE — Assessment & Plan Note (Addendum)
She will stop Metformin Continue OCP's She will continue to follow with GYN A1C today

## 2015-11-13 MED ORDER — ATORVASTATIN CALCIUM 10 MG PO TABS: 10.0000 mg | ORAL_TABLET | Freq: Every day | ORAL | Status: DC

## 2015-11-13 NOTE — Addendum Note (Signed)
Addended by: Roena MaladyEVONTENNO, Pryce Folts Y on: 11/13/2015 12:21 PM   Modules accepted: Orders

## 2015-12-19 ENCOUNTER — Other Ambulatory Visit: Payer: BLUE CROSS/BLUE SHIELD

## 2015-12-21 ENCOUNTER — Ambulatory Visit: Payer: BLUE CROSS/BLUE SHIELD | Admitting: Internal Medicine

## 2015-12-21 ENCOUNTER — Encounter: Payer: BLUE CROSS/BLUE SHIELD | Admitting: Internal Medicine

## 2015-12-21 ENCOUNTER — Telehealth: Payer: Self-pay | Admitting: Internal Medicine

## 2015-12-21 DIAGNOSIS — Z0289 Encounter for other administrative examinations: Secondary | ICD-10-CM

## 2015-12-21 NOTE — Telephone Encounter (Signed)
Yes she needs to follow up 

## 2015-12-21 NOTE — Telephone Encounter (Signed)
Patient did not come for their scheduled appointment today for 6 week follow up.  Please let me know if the patient needs to be contacted immediately for follow up or if no follow up is necessary.   ° °

## 2015-12-21 NOTE — Telephone Encounter (Signed)
Left message reminding pt of appointment date and time  °

## 2015-12-22 ENCOUNTER — Telehealth: Payer: Self-pay | Admitting: Internal Medicine

## 2015-12-22 NOTE — Telephone Encounter (Signed)
Pt called in and was upset about missing an appt. She states that she was told that she did not need to come to an appt, and that she never received a reminder call.  Pt is requesting to remove the no show fee.

## 2015-12-22 NOTE — Telephone Encounter (Signed)
We did discuss it and it was also on her AVS. We will not remove the no show fee.

## 2015-12-22 NOTE — Telephone Encounter (Signed)
Appointment 5/30 Pt aware

## 2016-01-16 ENCOUNTER — Encounter: Payer: Self-pay | Admitting: Internal Medicine

## 2016-01-16 ENCOUNTER — Ambulatory Visit (INDEPENDENT_AMBULATORY_CARE_PROVIDER_SITE_OTHER): Payer: BLUE CROSS/BLUE SHIELD | Admitting: Internal Medicine

## 2016-01-16 VITALS — BP 112/80 | HR 68 | Temp 98.0°F | Wt 198.0 lb

## 2016-01-16 DIAGNOSIS — F418 Other specified anxiety disorders: Secondary | ICD-10-CM | POA: Diagnosis not present

## 2016-01-16 DIAGNOSIS — F419 Anxiety disorder, unspecified: Secondary | ICD-10-CM

## 2016-01-16 DIAGNOSIS — E282 Polycystic ovarian syndrome: Secondary | ICD-10-CM | POA: Diagnosis not present

## 2016-01-16 DIAGNOSIS — F329 Major depressive disorder, single episode, unspecified: Secondary | ICD-10-CM

## 2016-01-16 DIAGNOSIS — F32A Depression, unspecified: Secondary | ICD-10-CM

## 2016-01-16 MED ORDER — BUPROPION HCL ER (XL) 150 MG PO TB24: 150.0000 mg | ORAL_TABLET | Freq: Every day | ORAL | Status: DC

## 2016-01-16 MED ORDER — NORGESTIMATE-ETH ESTRADIOL 0.25-35 MG-MCG PO TABS: 1.0000 | ORAL_TABLET | Freq: Every day | ORAL | Status: DC

## 2016-01-16 NOTE — Assessment & Plan Note (Signed)
OCP refilled today

## 2016-01-16 NOTE — Patient Instructions (Signed)
Generalized Anxiety Disorder Generalized anxiety disorder (GAD) is a mental disorder. It interferes with life functions, including relationships, work, and school. GAD is different from normal anxiety, which everyone experiences at some point in their lives in response to specific life events and activities. Normal anxiety actually helps us prepare for and get through these life events and activities. Normal anxiety goes away after the event or activity is over.  GAD causes anxiety that is not necessarily related to specific events or activities. It also causes excess anxiety in proportion to specific events or activities. The anxiety associated with GAD is also difficult to control. GAD can vary from mild to severe. People with severe GAD can have intense waves of anxiety with physical symptoms (panic attacks).  SYMPTOMS The anxiety and worry associated with GAD are difficult to control. This anxiety and worry are related to many life events and activities and also occur more days than not for 6 months or longer. People with GAD also have three or more of the following symptoms (one or more in children):  Restlessness.   Fatigue.  Difficulty concentrating.   Irritability.  Muscle tension.  Difficulty sleeping or unsatisfying sleep. DIAGNOSIS GAD is diagnosed through an assessment by your health care provider. Your health care provider will ask you questions aboutyour mood,physical symptoms, and events in your life. Your health care provider may ask you about your medical history and use of alcohol or drugs, including prescription medicines. Your health care provider may also do a physical exam and blood tests. Certain medical conditions and the use of certain substances can cause symptoms similar to those associated with GAD. Your health care provider may refer you to a mental health specialist for further evaluation. TREATMENT The following therapies are usually used to treat GAD:    Medication. Antidepressant medication usually is prescribed for long-term daily control. Antianxiety medicines may be added in severe cases, especially when panic attacks occur.   Talk therapy (psychotherapy). Certain types of talk therapy can be helpful in treating GAD by providing support, education, and guidance. A form of talk therapy called cognitive behavioral therapy can teach you healthy ways to think about and react to daily life events and activities.  Stress managementtechniques. These include yoga, meditation, and exercise and can be very helpful when they are practiced regularly. A mental health specialist can help determine which treatment is best for you. Some people see improvement with one therapy. However, other people require a combination of therapies.   This information is not intended to replace advice given to you by your health care provider. Make sure you discuss any questions you have with your health care provider.   Document Released: 11/30/2012 Document Revised: 08/26/2014 Document Reviewed: 11/30/2012 Elsevier Interactive Patient Education 2016 Elsevier Inc.  

## 2016-01-16 NOTE — Assessment & Plan Note (Signed)
Improved on Wellbutrin Refilled x 6 months

## 2016-01-16 NOTE — Progress Notes (Signed)
Pre visit review using our clinic review tool, if applicable. No additional management support is needed unless otherwise documented below in the visit note. 

## 2016-01-16 NOTE — Progress Notes (Signed)
Subjective:    Patient ID: Carmen Reid, female    DOB: Jan 05, 1996, 20 y.o.   MRN: [id number]  HPI  Pt presents to the clinic today to follow up anxiety. She was started on Wellbutrin 6 weeks ago. She has been taking it as directed and has not noticed any adverse effects. She feels like the medication is controlling her anxiety. She would like to get it refilled today. She has failed Prozac in the past. She denies current SI/HI.  She would like a refill of her birth control pills today. She takes it mainly for her PCOS.  Review of Systems  Past Medical History  Diagnosis Date  . PCOS (polycystic ovarian syndrome)   . Ovarian cyst   . Depression   . Anxiety   . 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 High cholesterol     Not on medication    Current Outpatient Prescriptions  Medication Sig Dispense Refill  . atorvastatin (LIPITOR) 10 MG tablet Take 1 tablet (10 mg total) by mouth daily. 30 tablet 2  . buPROPion (WELLBUTRIN XL) 150 MG 24 hr tablet Take 1 tablet (150 mg total) by mouth daily. 30 tablet 1  . FLUoxetine (PROZAC) 40 MG capsule Take 1 capsule (40 mg total) by mouth daily. 30 capsule 3  . norgestimate-ethinyl estradiol (ORTHO-CYCLEN,SPRINTEC,PREVIFEM) 0.25-35 MG-MCG tablet Take 1 tablet by mouth daily. 1 Package 11  . topiramate (TOPAMAX) 50 MG tablet Take 2 tablets by mouth at bedtime.     No current facility-administered medications for this visit.    Allergies  Allergen Reactions  . Latex Swelling  . Warfarin And Related Dermatitis    Family History  Problem Relation Age of Onset  . Cancer Maternal Uncle     liver cancer  . Kidney disease Maternal Uncle   . Cancer Maternal Grandmother 71    breast cancer/skin cancer  . Heart disease Maternal Grandmother   . Diabetes Maternal Grandmother   . Kidney disease Maternal Grandmother   . Hyperlipidemia Maternal Grandmother   . Hypertension Maternal  Grandmother   . Arthritis Maternal Grandmother   . Mental illness Maternal Grandmother   . Heart disease Maternal Grandfather   . Cancer Maternal Grandfather 76    prostate cancer  . Diabetes Maternal Grandfather   . Hyperlipidemia Maternal Grandfather   . Mental illness Maternal Grandfather   . Mental illness Paternal Grandmother   . Hypertension Paternal Grandmother   . Hypertension Mother   . Mental illness Mother   . Arthritis Father   . Hyperlipidemia Father   . Heart disease Father   . Stroke Maternal Aunt   . Heart disease Paternal Uncle   . Cancer Maternal Uncle     Colon    Social History   Social History  . Marital Status: Single    Spouse Name: N/A  . Number of Children: N/A  . Years of Education: N/A   Occupational History  . Not on file.   Social History Main Topics  . Smoking status: Never Smoker   . Smokeless tobacco: Never Used  . Alcohol Use: No  . Drug Use: No  . Sexual Activity: Yes    Birth Control/ Protection: Pill   Other Topics Concern  . Not on file   Social History Narrative     Constitutional: Denies fever, malaise, fatigue, headache or abrupt weight changes.  Gastrointestinal: Denies abdominal pain, bloating, constipation,  diarrhea or blood in the stool.  GU: Denies urgency, frequency, pain with urination, burning sensation, blood in urine, odor or discharge. Neurological: Denies dizziness, difficulty with memory, difficulty with speech or problems with balance and coordination.  Psych: Pt reports anxiety. Denies depression, SI/HI.  No other specific complaints in a complete review of systems (except as listed in HPI above).     Objective:   Physical Exam  BP 112/80 mmHg  Pulse 68  Temp(Src) 98 F (36.7 C) (Oral)  Wt 198 lb (89.812 kg)  LMP 01/14/2016 Wt Readings from Last 3 Encounters:  01/16/16 198 lb (89.812 kg)  11/09/15 205 lb (92.987 kg)  05/18/15 210 lb (95.255 kg) (98 %*, Z = 2.08)   * Growth percentiles are  based on CDC 2-20 Years data.    General: Appears her stated age, obese in NAD. Neurological: Alert and oriented.  Psychiatric: Mood and affect normal. Behavior is normal. Judgment and thought content normal.    BMET    Component Value Date/Time   NA 140 11/09/2015 1042   NA 133 06/21/2013 1436   K 3.7 11/09/2015 1042   K 3.3 06/21/2013 1436   CL 107 11/09/2015 1042   CL 100 06/21/2013 1436   CO2 23 11/09/2015 1042   CO2 24 06/21/2013 1436   GLUCOSE 95 11/09/2015 1042   GLUCOSE 108* 06/21/2013 1436   BUN 16 11/09/2015 1042   BUN 5* 06/21/2013 1436   CREATININE 0.80 11/09/2015 1042   CREATININE 0.76 06/21/2013 1436   CALCIUM 9.7 11/09/2015 1042   CALCIUM 9.8 06/21/2013 1436    Lipid Panel     Component Value Date/Time   CHOL 248* 11/09/2015 1042   TRIG * 11/09/2015 1042    437.0 Triglyceride is over 400; calculations on Lipids are invalid.   HDL 50.50 11/09/2015 1042   CHOLHDL 5 11/09/2015 1042   VLDL 61.8* 12/19/2014 1549    CBC    Component Value Date/Time   WBC 10.2 11/09/2015 1042   WBC 16.7* 06/21/2013 1436   RBC 4.76 11/09/2015 1042   RBC 4.92 06/21/2013 1436   HGB 14.1 11/09/2015 1042   HGB 15.1 06/21/2013 1436   HCT 41.4 11/09/2015 1042   HCT 42.6 06/21/2013 1436   PLT 322.0 11/09/2015 1042   PLT 275 06/21/2013 1436   MCV 86.9 11/09/2015 1042   MCV 87 06/21/2013 1436   MCH 30.0 05/18/2015 0853   MCH 30.6 06/21/2013 1436   MCHC 34.0 11/09/2015 1042   MCHC 35.4 06/21/2013 1436   RDW 13.4 11/09/2015 1042   RDW 12.8 06/21/2013 1436    Hgb A1C Lab Results  Component Value Date   HGBA1C 5.4 11/09/2015         Assessment & Plan:

## 2016-02-09 ENCOUNTER — Other Ambulatory Visit: Payer: Self-pay | Admitting: Internal Medicine

## 2016-02-09 ENCOUNTER — Telehealth: Payer: Self-pay | Admitting: Internal Medicine

## 2016-02-09 DIAGNOSIS — E785 Hyperlipidemia, unspecified: Secondary | ICD-10-CM

## 2016-02-09 DIAGNOSIS — G932 Benign intracranial hypertension: Secondary | ICD-10-CM

## 2016-02-09 NOTE — Telephone Encounter (Signed)
Referral placed.

## 2016-02-09 NOTE — Telephone Encounter (Signed)
Mom called wanting to get a referral to [facility] pt goes to dr Malvin Johnspotter in [place] she is not happy with him

## 2016-02-12 ENCOUNTER — Telehealth: Payer: Self-pay

## 2016-02-12 NOTE — Telephone Encounter (Signed)
Pt left v/m requesting tb skin test; left v/m requesting cb.

## 2016-02-13 ENCOUNTER — Other Ambulatory Visit: Payer: Self-pay | Admitting: Internal Medicine

## 2016-02-13 NOTE — Telephone Encounter (Signed)
V/m left for tb skin test nurse visit to be set up and left on v/m. Pt scheduled 02/14/16 at 3:30. V/m left with appt information as requested.

## 2016-02-14 ENCOUNTER — Ambulatory Visit: Payer: BLUE CROSS/BLUE SHIELD

## 2016-02-16 ENCOUNTER — Other Ambulatory Visit (INDEPENDENT_AMBULATORY_CARE_PROVIDER_SITE_OTHER): Payer: BLUE CROSS/BLUE SHIELD

## 2016-02-16 DIAGNOSIS — E785 Hyperlipidemia, unspecified: Secondary | ICD-10-CM | POA: Diagnosis not present

## 2016-02-16 LAB — COMPREHENSIVE METABOLIC PANEL
ALBUMIN: 4.3 g/dL (ref 3.5–5.2)
ALT: 25 U/L (ref 0–35)
AST: 13 U/L (ref 0–37)
Alkaline Phosphatase: 58 U/L (ref 39–117)
BUN: 16 mg/dL (ref 6–23)
CHLORIDE: 107 meq/L (ref 96–112)
CO2: 23 mEq/L (ref 19–32)
Calcium: 9.5 mg/dL (ref 8.4–10.5)
Creatinine, Ser: 0.83 mg/dL (ref 0.40–1.20)
GFR: 92.8 mL/min (ref >60.00)
Glucose, Bld: 82 mg/dL (ref 70–99)
POTASSIUM: 3.9 meq/L (ref 3.5–5.1)
SODIUM: 138 meq/L (ref 135–145)
Total Bilirubin: 0.4 mg/dL (ref 0.2–1.2)
Total Protein: 7.1 g/dL (ref 6.0–8.3)

## 2016-02-16 LAB — LIPID PANEL
CHOL/HDL RATIO: 4
CHOLESTEROL: 185 mg/dL (ref 0–200)
HDL: 42.7 mg/dL (ref >39.00)
NonHDL: 142.28
TRIGLYCERIDES: 283 mg/dL — AB (ref 0.0–149.0)
VLDL: 56.6 mg/dL — AB (ref 0.0–40.0)

## 2016-02-16 LAB — LDL CHOLESTEROL, DIRECT: Direct LDL: 107 mg/dL

## 2016-02-22 ENCOUNTER — Telehealth: Payer: Self-pay | Admitting: Internal Medicine

## 2016-02-22 NOTE — Telephone Encounter (Signed)
Patient's mother called about patient's lab work and said patient will try doing the fish oil instead of increasing the Lipitor.

## 2016-02-22 NOTE — Telephone Encounter (Signed)
Fish oil added to chart

## 2016-03-06 ENCOUNTER — Ambulatory Visit: Payer: Self-pay | Admitting: Neurology

## 2016-04-10 ENCOUNTER — Other Ambulatory Visit: Payer: Self-pay | Admitting: Internal Medicine

## 2016-04-11 MED ORDER — ATORVASTATIN CALCIUM 20 MG PO TABS: 20.0000 mg | ORAL_TABLET | Freq: Every day | ORAL | 2 refills | Status: DC

## 2016-04-23 ENCOUNTER — Ambulatory Visit (INDEPENDENT_AMBULATORY_CARE_PROVIDER_SITE_OTHER): Payer: BLUE CROSS/BLUE SHIELD | Admitting: Neurology

## 2016-04-23 ENCOUNTER — Encounter: Payer: Self-pay | Admitting: Neurology

## 2016-04-23 VITALS — BP 112/76 | HR 82 | Resp 16 | Ht 64.0 in | Wt 198.0 lb

## 2016-04-23 DIAGNOSIS — G932 Benign intracranial hypertension: Secondary | ICD-10-CM

## 2016-04-23 DIAGNOSIS — R0683 Snoring: Secondary | ICD-10-CM

## 2016-04-23 DIAGNOSIS — R51 Headache: Secondary | ICD-10-CM

## 2016-04-23 DIAGNOSIS — G4710 Hypersomnia, unspecified: Secondary | ICD-10-CM

## 2016-04-23 DIAGNOSIS — R519 Headache, unspecified: Secondary | ICD-10-CM

## 2016-04-23 DIAGNOSIS — E669 Obesity, unspecified: Secondary | ICD-10-CM

## 2016-04-23 NOTE — Progress Notes (Signed)
Subjective:    Patient ID: Latessa Tillis is a 19 y.o. female.  HPI     Huston Foley, MD, PhD [facility] [address], [address] [address] [place], [place] [address]  Dear Rene Kocher,   I saw your patient, Emmaleah Meroney, upon your kind request in my neurologic clinic today for initial consultation of her pseudotumor cerebri. The patient is accompanied by her father today. As you know, Ms. Muro is a 20 year old right-handed woman with an underlying medical history of PCO S, depression, anxiety, hyperlipidemia, and obesity, who was diagnosed with pseudotumor cerebri in the past and used to see a neurologist at [facility]. She and her family requested a change in doctors.  She had a fluoroscopic-guided lumbar puncture on 05/18/2015 and I reviewed the results. Opening pressure was 21 cm, closing pressure was 11 cm. She had a brain MRI with and without contrast on 05/15/2015 which I reviewed: IMPRESSION: 1. No acute intracranial abnormality or mass. 2. Partially empty sella, mildly prominent bilateral optic nerve sheaths, and possible bilateral transverse sinus stenoses. The constellation of findings could reflect idiopathic intracranial hypertension, and correlation with LP and opening pressure is suggested.  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  She tried Diamox in the past but had side effects after one dose, reports a swelling on the R side of her head.    I reviewed your office note from 01/16/2016.  Of note, she had improvement in her HAs since she started topamax, currently 50 mg each night. She has some occasional paresthesias from it. She sees her optometrist every 6 mo, appointment pending for this month.   She is sleepy, sleeps more than the average person, needs 8-10 hours or more to feel good.    Works part time at [employer], variable schedule, including nights.   Very little caffeine, non smoker, no drugs. Does not like to drink water. Single, no kids, lives with parents.   Her Past Medical History Is Significant For: Past Medical History:  Diagnosis Date  . Anxiety   . Depression   . High cholesterol    Not on medication  . Ovarian cyst   . PCOS (polycystic ovarian syndrome)   . 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 TONSILLECTOMY     age 50 or 7    Her Family History Is Significant For: Family History  Problem Relation Age of Onset  . Cancer Maternal Grandmother 24    breast cancer/skin cancer  . Heart disease Maternal Grandmother   . Diabetes Maternal Grandmother   . Kidney disease Maternal Grandmother   . Hyperlipidemia Maternal Grandmother   . Hypertension Maternal Grandmother   . Arthritis Maternal Grandmother   . Mental illness Maternal Grandmother   . Heart disease Maternal Grandfather   . Cancer Maternal Grandfather 69    prostate cancer  . Diabetes Maternal Grandfather   . Hyperlipidemia Maternal Grandfather   . Mental illness Maternal Grandfather   . Cancer Maternal Uncle     liver cancer  . Kidney disease Maternal Uncle   . Mental illness Paternal Grandmother   . Hypertension Paternal Grandmother   . Hypertension Mother   . Mental  illness Mother   . Arthritis Father   . Hyperlipidemia Father   . Heart disease Father   . Stroke Maternal Aunt   . Heart disease Paternal Uncle   . Cancer Maternal Uncle     Colon    Her Social History Is Significant For: Social History   Social History  . Marital status: Single    Spouse name: N/A  . Number of children: 0  . Years of education: HS   Occupational History  . [employer]    Social History Main Topics  . Smoking status: Never Smoker  .  Smokeless tobacco: Never Used  . Alcohol use No  . Drug use: No  . Sexual activity: Yes    Birth control/ protection: Pill   Other Topics Concern  . None   Social History Narrative   Occasional caffeine use     Her Allergies Are:  Allergies  Allergen Reactions  . Latex Swelling  . Warfarin And Related Dermatitis  :   Her Current Medications Are:  Outpatient Encounter Prescriptions as of 04/23/2016  Medication Sig  . buPROPion (WELLBUTRIN XL) 150 MG 24 hr tablet Take 1 tablet (150 mg total) by mouth daily.  . norgestimate-ethinyl estradiol (ORTHO-CYCLEN,SPRINTEC,PREVIFEM) 0.25-35 MG-MCG tablet Take 1 tablet by mouth daily.  . Omega-3 Fatty Acids (FISH OIL) 1000 MG CAPS Take 1 capsule by mouth 3 (three) times daily.  Marland Kitchen. topiramate (TOPAMAX) 50 MG tablet Take 2 tablets by mouth at bedtime.  . [DISCONTINUED] atorvastatin (LIPITOR) 20 MG tablet Take 1 tablet (20 mg total) by mouth daily.   No facility-administered encounter medications on file as of 04/23/2016.   : Review of Systems:  Out of a complete 14 point review of systems, all are reviewed and negative with the exception of these symptoms as listed below:  Review of Systems  Neurological:       Patient is looking for a new Neurologist, ([facility]) She was diagnosed with Pseudotumor Cerebri and had spinal tap.  Patient gets headaches and vision changes.  She has tried Diamox in the past, states it caused a "knot" in the back of her head and she was unable to move.     Objective:  Neurologic Exam  Physical Exam Physical Examination:   Vitals:   04/23/16 1445  BP: 112/76  Pulse: 82  Resp: 16    General Examination: The patient is a very pleasant 20 y.o. female in no acute distress. She appears well-developed and well-nourished and well groomed.   HEENT: Normocephalic, atraumatic, pupils are equal, round and reactive to light and accommodation. No obvious papilledema. Visual fields are full by  finger perimetry. Funduscopic exam is normal with sharp disc margins noted. Extraocular tracking is good without limitation to gaze excursion or nystagmus noted. Normal smooth pursuit is noted. Hearing is grossly intact. Face is symmetric with normal facial animation and normal facial sensation. Speech is clear with no dysarthria noted. There is no hypophonia. There is no lip, neck/head, jaw or voice tremor. Neck is supple with full range of passive and active motion. There are no carotid bruits on auscultation. Oropharynx exam reveals: mild mouth dryness, good dental hygiene and mild airway crowding, due to wider airway and smaller airway. Mallampati is class II. Tongue protrudes centrally and palate elevates symmetrically. Tonsils are absent.   Chest: Clear to auscultation without wheezing, rhonchi or crackles noted.  Heart: S1+S2+0, regular and normal without murmurs, rubs or gallops noted.   Abdomen: Soft,  non-tender and non-distended with normal bowel sounds appreciated on auscultation.  Extremities: There is no pitting edema in the distal lower extremities bilaterally. Pedal pulses are intact.  Skin: Warm and dry without trophic changes noted. There are no varicose veins.  Musculoskeletal: exam reveals no obvious joint deformities, tenderness or joint swelling or erythema.   Neurologically:  Mental status: The patient is awake, alert and oriented in all 4 spheres. Her immediate and remote memory, attention, language skills and fund of knowledge are appropriate. There is no evidence of aphasia, agnosia, apraxia or anomia. Speech is clear with normal prosody and enunciation. Thought process is linear. Mood is normal and affect is normal.  Cranial nerves II - XII are as described above under HEENT exam. In addition: shoulder shrug is normal with equal shoulder height noted. Motor exam: Normal bulk, strength and tone is noted. There is no drift, tremor or rebound. Romberg is negative. Reflexes are  2+ throughout. Babinski: Toes are flexor bilaterally. Fine motor skills and coordination: intact with normal finger taps, normal hand movements, normal rapid alternating patting, normal foot taps and normal foot agility.  Cerebellar testing: No dysmetria or intention tremor on finger to nose testing. Heel to shin is unremarkable bilaterally. There is no truncal or gait ataxia.  Sensory exam: intact to light touch, pinprick, vibration, temperature sense in the upper and lower extremities.  Gait, station and balance: She stands easily. No veering to one side is noted. No leaning to one side is noted. Posture is age-appropriate and stance is narrow based. Gait shows normal stride length and normal pace. No problems turning are noted. Tandem walk is unremarkable.   Assessment and Plan:   In summary, Syesha Lolitha Tortora is a very pleasant 20 y.o.-year old female with an underlying medical history of PCO S, depression, anxiety, hyperlipidemia, and obesity, who was diagnosed with pseudotumor cerebri about a year ago. She had a lumbar puncture in September 2016 and also her brain MRI with and without contrast which was unremarkable with the exception of empty sella syndrome. Her physical exam and neurological exam are nonfocal, papilledema not detectable on my exam today andoshe feels that her eyes have been stable. Nevertheless, would like for her to establish care with an ophthalmologist. She will look into that. She has been seeing an optometrist on a regular 6 monthly basis. In addition, I would like to proceed with a sleep study to rule out sleep apnea, father had a sleep study and was told he had sleep apnea. Mother has a history of pseudotumor cerebri and obesity and went through gastric bypass surgery. Patient has been on Topamax, reports side effects with one dose of Diamox in the past, she has been stable on 50 mg of topiramate which I asked her to continue. She did not need a refill today.  I will  see her back routinely in about 3 months, sooner as needed. We will try to get records from her previous neurologist at [facility] clinic.  I answered all their questions today and the patient and her father were in agreement with the above outlined plan.   Thank you very much for allowing me to participate in the care of this nice patient. If I can be of any further assistance to you please do not hesitate to call me at [phone].  Sincerely,   Huston Foley, MD, PhD

## 2016-04-23 NOTE — Patient Instructions (Signed)
Let's continue with the topamax at the current dose.   Please establish care with an ophthalmologist too.   We will do a sleep study to rule out sleep apnea.   Our sleep lab administrative assistant, Alvis LemmingsDawn will meet with you or call you to schedule your sleep study. If you don't hear back from her by next week please feel free to call her at [phone]. This is her direct line and please leave a message with your phone number to call back if you get the voicemail box. She will call back as soon as possible.

## 2016-06-12 ENCOUNTER — Telehealth: Payer: Self-pay | Admitting: Neurology

## 2016-06-12 NOTE — Telephone Encounter (Signed)
Patient's mother is calling to get a referral for the patient to see an Ophthalmologist.

## 2016-06-12 NOTE — Telephone Encounter (Signed)
I spoke to mother and advised her that patient does not need a referral for this. She voiced understanding and will call back if needs anything else.  .Marland Kitchen

## 2016-06-12 NOTE — Telephone Encounter (Signed)
Pt called to schedule sleep study. She said she is on her father's insurance policy and is wanting to get it scheduled before it terminates

## 2016-06-18 ENCOUNTER — Ambulatory Visit (INDEPENDENT_AMBULATORY_CARE_PROVIDER_SITE_OTHER): Payer: BLUE CROSS/BLUE SHIELD | Admitting: Neurology

## 2016-06-18 DIAGNOSIS — R0683 Snoring: Secondary | ICD-10-CM

## 2016-06-18 DIAGNOSIS — G4761 Periodic limb movement disorder: Secondary | ICD-10-CM

## 2016-06-18 DIAGNOSIS — G4720 Circadian rhythm sleep disorder, unspecified type: Secondary | ICD-10-CM

## 2016-06-25 ENCOUNTER — Telehealth: Payer: Self-pay | Admitting: Neurology

## 2016-06-25 NOTE — Progress Notes (Signed)
PATIENT'S NAME:  Carmen Reid, Carmen Reid DOB:      [birth year]      MR#:    [id number]     DATE OF RECORDING: 06/18/2016 REFERRING M.D.:  Nicki Reaperegina Baity, NP Study Performed:   Baseline Polysomnogram HISTORY:  20 year old right-handed woman with an underlying medical history of PCOS, pseudotumor cerebri, depression, anxiety, hyperlipidemia, and obesity, who reports daytime sleepiness and a FHx of OSa.  The patient's weight 20 pounds with a height of 64 (inches), resulting in a BMI of 33.9 kg/m2. The patient's neck circumference measured 15 inches.  CURRENT MEDICATIONS: Wellbutrin, Ortho-Cyclen, Omega-3, Topamax.   PROCEDURE:  This is a multichannel digital polysomnogram utilizing the Somnostar 11.2 system.  Electrodes and sensors were applied and monitored per AASM Specifications.   EEG, EOG, Chin and Limb EMG, were sampled at 200 Hz.  ECG, Snore and Nasal Pressure, Thermal Airflow, Respiratory Effort, CPAP Flow and Pressure, Oximetry was sampled at 50 Hz. Digital video and audio were recorded.      BASELINE STUDY  Lights Out was at 21:46 and Lights On at 05:01. Total recording time (TRT) was 435.5 minutes, with a total sleep time (TST) of  335.5 minutes.   The patient's sleep latency was 89.5 minutes.  REM latency was 68 minutes.  The sleep efficiency was 77%.     SLEEP ARCHITECTURE: WASO (Wake after sleep onset) was 12.5 minutes with minimal sleep fragmentation noted.  There were 9 minutes in Stage N1, 168 minutes Stage N2, 95.5 minutes Stage N3 and 63 minutes in Stage REM.  The percentage of Stage N1 was 2.7%, Stage N2 was 50.1%, which is normal, Stage N3 was 28.5%, which is mildly increased, and Stage R (REM sleep) was 18.8%, which is near normal.   Audio and video analysis did not show any abnormal or unusual movements, behaviors, phonations or vocalizations.   The patient took no bathroom breaks. Mild intermittent snoring was noted. EKG was in keeping with normal sinus rhythm (NSR).  RESPIRATORY  ANALYSIS:  There were a total of 10 respiratory events:  0 obstructive apneas, 0 central apneas and 0 mixed apneas with a total of 0 apneas and an apnea index (AI) of 0 /hour. There were 10 hypopneas with a hypopnea index of 1.8 /hour. The patient also had 0 respiratory event related arousals (RERAs).      The total APNEA/HYPOPNEA INDEX (AHI) was 1.8/hour and the total RESPIRATORY DISTURBANCE INDEX was 1.8 /hour.  1 events occurred in REM sleep and 18 events in NREM. The REM AHI was 1. /hour, versus a non-REM AHI of 2.. The patient spent 302.5 minutes of total sleep time in the supine position and 33 minutes in non-supine.. The supine AHI was 1.8 versus a non-supine AHI of 1.8.  OXYGEN SATURATION & C02:  The Wake baseline 02 saturation was 97%, with the lowest being 93%. Time spent below 89% saturation equaled 0 minutes.   PERIODIC LIMB MOVEMENTS:   The patient had a total of 133 Periodic Limb Movements.  The Periodic Limb Movement (PLM) index was 23.8/hour, which is mildly increased, and the PLM Arousal index was .9/hour.  Post-study, the patient indicated that sleep was the same as usual.   IMPRESSION:  1. Periodic Limb Movement Disorder (PLMD) 2. Primary Snoring 3. Dysfunctions associated with sleep stages or arousal from sleep  RECOMMENDATIONS:  1. This study does not demonstrate any significant obstructive or central sleep disordered breathing.  2. Mild PLMs (periodic limb movements of sleep) were noted during the  study without significant arousals; clinical correlation is recommended.  3. This study shows some sleep fragmentation and mildly abnormal sleep stage percentages; these are nonspecific findings and per se do not signify an intrinsic sleep disorder or a cause for the patient's sleep-related symptoms. Causes include (but are not limited to) the first night effect of the sleep study, circadian rhythm disturbances, medication effect or an underlying mood disorder or medical problem.   4. The patient should be cautioned not to drive, work at heights, or operate dangerous or heavy equipment when tired or sleepy. Review and reiteration of good sleep hygiene measures should be pursued with any patient. 5. The patient will be [facility] in [facility] for discussion of the test results and further management strategies. The referring provider will be notified of the test results.    I certify that I have reviewed the entire raw data recording prior to the issuance of this report in accordance with the Standards of Accreditation of the American Academy of Sleep Medicine (AASM)      Huston FoleySaima Calel Pisarski, MD, PhD Diplomat, American Board of Psychiatry and Neurology  Diplomat, American Board of Sleep Medicine

## 2016-06-25 NOTE — Telephone Encounter (Signed)
Patient referred by PCP, seen by me on 04/23/16 for PTC, diagnostic PSG on 06/18/16 .   Please call and notify the patient that the recent sleep study did not show any significant obstructive sleep apnea. Had mild PLMs, but without significant sleep disturbance. Please inform patient that we can go over the details of the study during her appt next month. Also, route or fax report to PCP and referring MD, if other than PCP.  Once you have spoken to patient, you can close this encounter.   Thanks,  Star Age, MD, PhD [facility])

## 2016-06-26 NOTE — Telephone Encounter (Signed)
I spoke to patient and she is aware of results and recommendations. Reminded her of appt next month.

## 2016-06-26 NOTE — Telephone Encounter (Signed)
Report sent to PCP.

## 2016-07-01 ENCOUNTER — Other Ambulatory Visit: Payer: Self-pay | Admitting: Internal Medicine

## 2016-07-23 ENCOUNTER — Ambulatory Visit: Payer: BLUE CROSS/BLUE SHIELD | Admitting: Neurology

## 2016-09-19 ENCOUNTER — Ambulatory Visit (INDEPENDENT_AMBULATORY_CARE_PROVIDER_SITE_OTHER): Payer: BLUE CROSS/BLUE SHIELD | Admitting: Internal Medicine

## 2016-09-19 ENCOUNTER — Encounter: Payer: Self-pay | Admitting: Internal Medicine

## 2016-09-19 ENCOUNTER — Ambulatory Visit (INDEPENDENT_AMBULATORY_CARE_PROVIDER_SITE_OTHER)
Admission: RE | Admit: 2016-09-19 | Discharge: 2016-09-19 | Disposition: A | Discharge status: Home / Self Care | Payer: BLUE CROSS/BLUE SHIELD | Source: Ambulatory Visit | Attending: Internal Medicine | Admitting: Internal Medicine

## 2016-09-19 VITALS — BP 118/80 | HR 95 | Temp 97.7°F | Wt 215.0 lb

## 2016-09-19 DIAGNOSIS — M79672 Pain in left foot: Secondary | ICD-10-CM | POA: Diagnosis not present

## 2016-09-19 DIAGNOSIS — M25475 Effusion, left foot: Secondary | ICD-10-CM | POA: Diagnosis not present

## 2016-09-19 DIAGNOSIS — M7989 Other specified soft tissue disorders: Secondary | ICD-10-CM | POA: Diagnosis not present

## 2016-09-19 NOTE — Progress Notes (Signed)
Subjective:    Patient ID: Carmen Reid, female    DOB: 21-Sep-1995, 20 y.o.   MRN: [id number]  HPI  Pt presents to the clinic today with c/o swelling in her left foot. This started 5 years ago. The swelling only occurs on the top of the foot. It is worse in the afternoon and after sitting for long periods of time. It can cause pain due to the pressure. She reports she occasionally feels "pins and needles" in that foot. She denies injury to the area. She reports she has had this evaluated in the past with xray, but can not remember where. She thinks she may have also seen a podiatrist in the past but is not positive. She has not taken anything OTC for this.  Review of Systems      Past Medical History:  Diagnosis Date  . Anxiety   . Depression   . High cholesterol    Not on medication  . Ovarian cyst   . PCOS (polycystic ovarian syndrome)   . 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 buPROPion (WELLBUTRIN XL) 150 MG 24 hr tablet Take 1 tablet (150 mg total) by mouth daily. MUST SCHEDULE ANNUAL EXAM 30 tablet 2  . norgestimate-ethinyl estradiol (ORTHO-CYCLEN,SPRINTEC,PREVIFEM) 0.25-35 MG-MCG tablet Take 1 tablet by mouth daily. 1 Package 11  . Omega-3 Fatty Acids (FISH OIL) 1000 MG CAPS Take 1 capsule by mouth 3 (three) times daily.    Marland Kitchen. topiramate (TOPAMAX) 50 MG tablet Take 2 tablets by mouth at bedtime.     No current facility-administered medications for this visit.     Allergies  Allergen Reactions  . Latex Swelling  . Warfarin And Related Dermatitis    Family History  Problem Relation Age of Onset  . Cancer Maternal Grandmother [id number]    breast cancer/skin cancer  . Heart disease Maternal Grandmother   . Diabetes Maternal Grandmother   . Kidney disease Maternal Grandmother   . Hyperlipidemia Maternal Grandmother   . Hypertension Maternal Grandmother    . Arthritis Maternal Grandmother   . Mental illness Maternal Grandmother   . Heart disease Maternal Grandfather   . Cancer Maternal Grandfather [id number]    prostate cancer  . Diabetes Maternal Grandfather   . Hyperlipidemia Maternal Grandfather   . Mental illness Maternal Grandfather   . Cancer Maternal Uncle     liver cancer  . Kidney disease Maternal Uncle   . Mental illness Paternal Grandmother   . Hypertension Paternal Grandmother   . Hypertension Mother   . Mental illness Mother   . Arthritis Father   . Hyperlipidemia Father   . Heart disease Father   . Stroke Maternal Aunt   . Heart disease Paternal Uncle   . Cancer Maternal Uncle     Colon    Social History   Social History  . Marital status: Single    Spouse name: N/A  . Number of children: 0  . Years of education: HS   Occupational History  . [employer]    Social History Main Topics  . Smoking status: Never Smoker  . Smokeless tobacco: Never Used  . Alcohol use No  . Drug use: No  . Sexual activity: Yes    Birth control/ protection: Pill   Other Topics Concern  . Not on file   Social History Narrative   Occasional  caffeine use      Constitutional: Denies fever, malaise, fatigue, headache or abrupt weight changes.  Musculoskeletal: Pt reports left foot swelling. Denies decrease in range of motion, difficulty with gait, muscle pain.  Skin: Denies redness, rashes, lesions or ulcercations.  Neurological: Pt reports intermittent paresthesia of left foot. Denies dizziness, difficulty with memory, difficulty with speech or problems with balance and coordination.    No other specific complaints in a complete review of systems (except as listed in HPI above).  Objective:   Physical Exam   BP 118/80   Pulse 95   Temp 97.7 F (36.5 C) (Oral)   Wt 215 lb (97.5 kg)   LMP 08/26/2016   SpO2 98%   BMI 36.90 kg/m  Wt Readings from Last 3 Encounters:  09/19/16 215 lb (97.5 kg)  04/23/16 198 lb (89.8  kg)  01/16/16 198 lb (89.8 kg)    General: Appears her stated age, obese in NAD. Skin: Warm, dry and intact. Musculoskeletal: Normal flexion, extension and rotation of the left ankle. No swelling noted of the left foot but appears to be more subcutaneous tissue on the dorsal part of the left foot. No difficulty with gait.  Neurological: Alert and oriented. Sensation intact to BLE.   BMET    Component Value Date/Time   NA 138 02/16/2016 1120   NA 133 06/21/2013 1436   K 3.9 02/16/2016 1120   K 3.3 06/21/2013 1436   CL 107 02/16/2016 1120   CL 100 06/21/2013 1436   CO2 23 02/16/2016 1120   CO2 24 06/21/2013 1436   GLUCOSE 82 02/16/2016 1120   GLUCOSE 108 (H) 06/21/2013 1436   BUN 16 02/16/2016 1120   BUN 5 (L) 06/21/2013 1436   CREATININE 0.83 02/16/2016 1120   CREATININE 0.76 06/21/2013 1436   CALCIUM 9.5 02/16/2016 1120   CALCIUM 9.8 06/21/2013 1436    Lipid Panel     Component Value Date/Time   CHOL 185 02/16/2016 1120   TRIG 283.0 (H) 02/16/2016 1120   HDL 42.70 02/16/2016 1120   CHOLHDL 4 02/16/2016 1120   VLDL 56.6 (H) 02/16/2016 1120    CBC    Component Value Date/Time   WBC 10.2 11/09/2015 1042   RBC 4.76 11/09/2015 1042   HGB 14.1 11/09/2015 1042   HGB 15.1 06/21/2013 1436   HCT 41.4 11/09/2015 1042   HCT 42.6 06/21/2013 1436   PLT 322.0 11/09/2015 1042   PLT 275 06/21/2013 1436   MCV 86.9 11/09/2015 1042   MCV 87 06/21/2013 1436   MCH 30.0 05/18/2015 0853   MCHC 34.0 11/09/2015 1042   RDW 13.4 11/09/2015 1042   RDW 12.8 06/21/2013 1436    Hgb A1C Lab Results  Component Value Date   HGBA1C 5.4 11/09/2015        Assessment & Plan:   Left foot swelling and pain:  She is requesting repeat xray today Advised her that I do not think there can be much done to fix this Encouraged elevation while sitting If worsens, consider referral to podiatry  Will follow up after xray. RTC as needed Nicki Reaper, NP

## 2016-09-19 NOTE — Patient Instructions (Signed)
Foot Pain Introduction Many things can cause foot pain. Some common causes are:  An injury.  A sprain.  Arthritis.  Blisters.  Bunions. Follow these instructions at home: Pay attention to any changes in your symptoms. Take these actions to help with your discomfort:  If directed, put ice on the affected area:  Put ice in a plastic bag.  Place a towel between your skin and the bag.  Leave the ice on for 15-20 minutes, 3?4 times a day for 2 days.  Take over-the-counter and prescription medicines only as told by your health care provider.  Wear comfortable, supportive shoes that fit you well. Do not wear high heels.  Do not stand or walk for long periods of time.  Do not lift a lot of weight. This can put added pressure on your feet.  Do stretches to relieve foot pain and stiffness as told by your health care provider.  Rub your foot gently.  Keep your feet clean and dry. Contact a health care provider if:  Your pain does not get better after a few days of self-care.  Your pain gets worse.  You cannot stand on your foot. Get help right away if:  Your foot is numb or tingling.  Your foot or toes are swollen.  Your foot or toes turn white or blue.  You have warmth and redness along your foot. This information is not intended to replace advice given to you by your health care provider. Make sure you discuss any questions you have with your health care provider. Document Released: 09/01/2015 Document Revised: 01/11/2016 Document Reviewed: 08/31/2014  2017 Elsevier  

## 2016-10-07 ENCOUNTER — Telehealth: Payer: Self-pay | Admitting: Internal Medicine

## 2016-10-07 DIAGNOSIS — H4710 Unspecified papilledema: Secondary | ICD-10-CM | POA: Diagnosis not present

## 2016-10-07 DIAGNOSIS — G932 Benign intracranial hypertension: Secondary | ICD-10-CM | POA: Diagnosis not present

## 2016-10-07 NOTE — Telephone Encounter (Signed)
Patient said she dropped off records from [facility].  There were 2 Ultrasounds done.  Patient is waiting for a call back.

## 2016-10-07 NOTE — Telephone Encounter (Signed)
I have the copies of the ultrasounds. This "swelling" is not related to heart failure or DVT. I don't even think it is fluid given that she has taken diuretics without relief. I think it is due to extra subcutaneous tissue in that area.

## 2016-10-07 NOTE — Telephone Encounter (Signed)
Left detailed msg on VM per HIPAA  

## 2016-12-16 ENCOUNTER — Telehealth: Payer: Self-pay | Admitting: Internal Medicine

## 2016-12-16 DIAGNOSIS — E282 Polycystic ovarian syndrome: Secondary | ICD-10-CM

## 2016-12-19 NOTE — Telephone Encounter (Signed)
Pt said when she was seen 09/19/16 that Pamala Hurry Baity NP told pt she did not need to schedule a physical and she would refill BC pills. Pt request cb.

## 2016-12-20 NOTE — Telephone Encounter (Signed)
Left message on voicemail.

## 2016-12-20 NOTE — Telephone Encounter (Signed)
Have her schedule appt for annual exam. Refill her OCP until that time.

## 2016-12-20 NOTE — Telephone Encounter (Signed)
You have never done a CPE on pt and she is coming up on a years since she had labs

## 2016-12-23 NOTE — Telephone Encounter (Signed)
Patient called and scheduled physical on 01/02/17.  Patient lost her bcp.  Please call rx in to [facility]. Please call patient at [phone] when rx is called in to pharmacy.

## 2016-12-27 ENCOUNTER — Other Ambulatory Visit: Payer: Self-pay | Admitting: Internal Medicine

## 2016-12-27 DIAGNOSIS — E282 Polycystic ovarian syndrome: Secondary | ICD-10-CM

## 2017-01-02 ENCOUNTER — Ambulatory Visit (INDEPENDENT_AMBULATORY_CARE_PROVIDER_SITE_OTHER): Payer: BLUE CROSS/BLUE SHIELD | Admitting: Internal Medicine

## 2017-01-02 ENCOUNTER — Encounter: Payer: Self-pay | Admitting: Internal Medicine

## 2017-01-02 VITALS — BP 116/74 | HR 98 | Temp 97.9°F | Ht 64.5 in | Wt 218.0 lb

## 2017-01-02 DIAGNOSIS — R5383 Other fatigue: Secondary | ICD-10-CM | POA: Diagnosis not present

## 2017-01-02 DIAGNOSIS — E7800 Pure hypercholesterolemia, unspecified: Secondary | ICD-10-CM | POA: Diagnosis not present

## 2017-01-02 DIAGNOSIS — Z0000 Encounter for general adult medical examination without abnormal findings: Secondary | ICD-10-CM

## 2017-01-02 MED ORDER — NORGESTIMATE-ETH ESTRADIOL 0.25-35 MG-MCG PO TABS: 1.0000 | ORAL_TABLET | Freq: Every day | ORAL | 11 refills | Status: DC

## 2017-01-02 NOTE — Progress Notes (Signed)
Subjective:    Patient ID: Carmen Reid, female    DOB: Aug 10, 1996, 21 y.o.   MRN: [id number]  HPI  Pt presents to the clinic today for her annual exam.  Flu: never Tetanus: about 10 years ago Pap Smear: 09/2014 Dentist: biannually  Diet: She does eat meat. She consumes fruits and veggies. She does et fried foods. She drinks mostly water, lemonade. Some soda. Exercise: None  Review of Systems      Past Medical History:  Diagnosis Date  . Anxiety   . Depression   . High cholesterol    Not on medication  . Ovarian cyst   . PCOS (polycystic ovarian syndrome)   . 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 buPROPion (WELLBUTRIN XL) 150 MG 24 hr tablet Take 1 tablet (150 mg total) by mouth daily. MUST SCHEDULE ANNUAL EXAM 30 tablet 2  . norgestimate-ethinyl estradiol (SPRINTEC 28) 0.25-35 MG-MCG tablet Take 1 tablet by mouth daily. MUST SCHEDULE ANNUAL PHYSICAL EXAM 1 Package 1  . Omega-3 Fatty Acids (FISH OIL) 1000 MG CAPS Take 1 capsule by mouth 3 (three) times daily.    Marland Kitchen topiramate (TOPAMAX) 50 MG tablet Take 2 tablets by mouth at bedtime.     No current facility-administered medications for this visit.     Allergies  Allergen Reactions  . Latex Swelling  . Warfarin And Related Dermatitis    Family History  Problem Relation Age of Onset  . Cancer Maternal Grandmother 23       breast cancer/skin cancer  . Heart disease Maternal Grandmother   . Diabetes Maternal Grandmother   . Kidney disease Maternal Grandmother   . Hyperlipidemia Maternal Grandmother   . Hypertension Maternal Grandmother   . Arthritis Maternal Grandmother   . Mental illness Maternal Grandmother   . Heart disease Maternal Grandfather   . Cancer Maternal Grandfather 46       prostate cancer  . Diabetes Maternal Grandfather   . Hyperlipidemia Maternal Grandfather   . Mental  illness Maternal Grandfather   . Cancer Maternal Uncle        liver cancer  . Kidney disease Maternal Uncle   . Mental illness Paternal Grandmother   . Hypertension Paternal Grandmother   . Hypertension Mother   . Mental illness Mother   . Arthritis Father   . Hyperlipidemia Father   . Heart disease Father   . Stroke Maternal Aunt   . Heart disease Paternal Uncle   . Cancer Maternal Uncle        Colon    Social History   Social History  . Marital status: Single    Spouse name: N/A  . Number of children: 0  . Years of education: HS   Occupational History  . [employer]    Social History Main Topics  . Smoking status: Never Smoker  . Smokeless tobacco: Never Used  . Alcohol use No  . Drug use: No  . Sexual activity: Yes    Birth control/ protection: Pill   Other Topics Concern  . Not on file   Social History Narrative   Occasional caffeine use      Constitutional: Pt reports fatigue. Denies fever, malaise, headache or abrupt weight changes.  HEENT: Denies eye pain, eye redness, ear pain, ringing in the ears, wax buildup, runny nose, nasal congestion, bloody nose, or sore  throat. Respiratory: Denies difficulty breathing, shortness of breath, cough or sputum production.   Cardiovascular: Denies chest pain, chest tightness, palpitations or swelling in the hands.  Gastrointestinal: Denies abdominal pain, bloating, constipation, diarrhea or blood in the stool.  GU: Denies urgency, frequency, pain with urination, burning sensation, blood in urine, odor or discharge. Musculoskeletal: Denies decrease in range of motion, difficulty with gait, muscle pain or joint pain and swelling.  Skin: Denies redness, rashes, lesions or ulcercations.  Neurological: Denies dizziness, difficulty with memory, difficulty with speech or problems with balance and coordination.  Psych: Denies anxiety, depression, SI/HI.  No other specific complaints in a complete review of systems (except as  listed in HPI above).  Objective:   Physical Exam   BP 116/74   Pulse 98   Temp 97.9 F (36.6 C) (Oral)   Ht 5' 4.5" (1.638 m)   Wt 218 lb (98.9 kg)   LMP 12/18/2016   SpO2 98%   BMI 36.84 kg/m  Wt Readings from Last 3 Encounters:  01/02/17 218 lb (98.9 kg)  09/19/16 215 lb (97.5 kg)  04/23/16 198 lb (89.8 kg)    General: Appears her stated age, obese in NAD. Skin: Warm, dry and intact.  HEENT: Head: normal shape and size; Eyes: sclera white, no icterus, conjunctiva pink, PERRLA and EOMs intact; Ears: Tm's gray and intact, normal light reflex; Throat/Mouth: Teeth present, mucosa pink and moist, no exudate, lesions or ulcerations noted.  Neck:  Neck supple, trachea midline. No masses, lumps or thyromegaly present.  Cardiovascular: Normal rate and rhythm. S1,S2 noted.  No murmur, rubs or gallops noted. No JVD or BLE edema.  Pulmonary/Chest: Normal effort and positive vesicular breath sounds. No respiratory distress. No wheezes, rales or ronchi noted.  Abdomen: Soft and nontender. Normal bowel sounds. No distention or masses noted. Liver, spleen and kidneys non palpable. Musculoskeletal: Strength 5/5 BUE/BLE. No difficulty with gait.  Neurological: Alert and oriented. Cranial nerves II-XII grossly intact. Coordination normal.  Psychiatric: Mood and affect normal. Behavior is normal. Judgment and thought content normal.    BMET    Component Value Date/Time   NA 138 02/16/2016 1120   NA 133 06/21/2013 1436   K 3.9 02/16/2016 1120   K 3.3 06/21/2013 1436   CL 107 02/16/2016 1120   CL 100 06/21/2013 1436   CO2 23 02/16/2016 1120   CO2 24 06/21/2013 1436   GLUCOSE 82 02/16/2016 1120   GLUCOSE 108 (H) 06/21/2013 1436   BUN 16 02/16/2016 1120   BUN 5 (L) 06/21/2013 1436   CREATININE 0.83 02/16/2016 1120   CREATININE 0.76 06/21/2013 1436   CALCIUM 9.5 02/16/2016 1120   CALCIUM 9.8 06/21/2013 1436    Lipid Panel     Component Value Date/Time   CHOL 185 02/16/2016 1120    TRIG 283.0 (H) 02/16/2016 1120   HDL 42.70 02/16/2016 1120   CHOLHDL 4 02/16/2016 1120   VLDL 56.6 (H) 02/16/2016 1120    CBC    Component Value Date/Time   WBC 10.2 11/09/2015 1042   RBC 4.76 11/09/2015 1042   HGB 14.1 11/09/2015 1042   HGB 15.1 06/21/2013 1436   HCT 41.4 11/09/2015 1042   HCT 42.6 06/21/2013 1436   PLT 322.0 11/09/2015 1042   PLT 275 06/21/2013 1436   MCV 86.9 11/09/2015 1042   MCV 87 06/21/2013 1436   MCH 30.0 05/18/2015 0853   MCHC 34.0 11/09/2015 1042   RDW 13.4 11/09/2015 1042   RDW 12.8 06/21/2013 1436  Hgb A1C Lab Results  Component Value Date   HGBA1C 5.4 11/09/2015           Assessment & Plan:   Preventative Health Maintenance:  Encouraged her to get a flu shot in the fall She declines tetanus booster today Pap smear due 2019 Encouraged her to consume a balanced diet and start an exercise program Advised her to see a dentist annually Will check CBC< CMET, Lipid profile today  Fatigue:  Will check TSH, B12 and Vit D  RTC in 1 year, sooner if needed Nicki ReaperBAITY, REGINA, NP

## 2017-01-02 NOTE — Patient Instructions (Signed)
Health Maintenance, Female Adopting a healthy lifestyle and getting preventive care can go a long way to promote health and wellness. Talk with your health care provider about what schedule of regular examinations is right for you. This is a good chance for you to check in with your provider about disease prevention and staying healthy. In between checkups, there are plenty of things you can do on your own. Experts have done a lot of research about which lifestyle changes and preventive measures are most likely to keep you healthy. Ask your health care provider for more information. Weight and diet Eat a healthy diet  Be sure to include plenty of vegetables, fruits, low-fat dairy products, and lean protein.  Do not eat a lot of foods high in solid fats, added sugars, or salt.  Get regular exercise. This is one of the most important things you can do for your health.  Most adults should exercise for at least 150 minutes each week. The exercise should increase your heart rate and make you sweat (moderate-intensity exercise).  Most adults should also do strengthening exercises at least twice a week. This is in addition to the moderate-intensity exercise. Maintain a healthy weight  Body mass index (BMI) is a measurement that can be used to identify possible weight problems. It estimates body fat based on height and weight. Your health care provider can help determine your BMI and help you achieve or maintain a healthy weight.  For females 76 years of age and older:  A BMI below 18.5 is considered underweight.  A BMI of 18.5 to 24.9 is normal.  A BMI of 25 to 29.9 is considered overweight.  A BMI of 30 and above is considered obese. Watch levels of cholesterol and blood lipids  You should start having your blood tested for lipids and cholesterol at 21 years of age, then have this test every 5 years.  You may need to have your cholesterol levels checked more often if:  Your lipid or  cholesterol levels are high.  You are older than 21 years of age.  You are at high risk for heart disease. Cancer screening Lung Cancer  Lung cancer screening is recommended for adults 64-42 years old who are at high risk for lung cancer because of a history of smoking.  A yearly low-dose CT scan of the lungs is recommended for people who:  Currently smoke.  Have quit within the past 15 years.  Have at least a 30-pack-year history of smoking. A pack year is smoking an average of one pack of cigarettes a day for 1 year.  Yearly screening should continue until it has been 15 years since you quit.  Yearly screening should stop if you develop a health problem that would prevent you from having lung cancer treatment. Breast Cancer  Practice breast self-awareness. This means understanding how your breasts normally appear and feel.  It also means doing regular breast self-exams. Let your health care provider know about any changes, no matter how small.  If you are in your 20s or 30s, you should have a clinical breast exam (CBE) by a health care provider every 1-3 years as part of a regular health exam.  If you are 34 or older, have a CBE every year. Also consider having a breast X-ray (mammogram) every year.  If you have a family history of breast cancer, talk to your health care provider about genetic screening.  If you are at high risk for breast cancer, talk  to your health care provider about having an MRI and a mammogram every year.  Breast cancer gene (BRCA) assessment is recommended for women who have family members with BRCA-related cancers. BRCA-related cancers include:  Breast.  Ovarian.  Tubal.  Peritoneal cancers.  Results of the assessment will determine the need for genetic counseling and BRCA1 and BRCA2 testing. Cervical Cancer  Your health care provider may recommend that you be screened regularly for cancer of the pelvic organs (ovaries, uterus, and vagina).  This screening involves a pelvic examination, including checking for microscopic changes to the surface of your cervix (Pap test). You may be encouraged to have this screening done every 3 years, beginning at age 24.  For women ages 66-65, health care providers may recommend pelvic exams and Pap testing every 3 years, or they may recommend the Pap and pelvic exam, combined with testing for human papilloma virus (HPV), every 5 years. Some types of HPV increase your risk of cervical cancer. Testing for HPV may also be done on women of any age with unclear Pap test results.  Other health care providers may not recommend any screening for nonpregnant women who are considered low risk for pelvic cancer and who do not have symptoms. Ask your health care provider if a screening pelvic exam is right for you.  If you have had past treatment for cervical cancer or a condition that could lead to cancer, you need Pap tests and screening for cancer for at least 20 years after your treatment. If Pap tests have been discontinued, your risk factors (such as having a new sexual partner) need to be reassessed to determine if screening should resume. Some women have medical problems that increase the chance of getting cervical cancer. In these cases, your health care provider may recommend more frequent screening and Pap tests. Colorectal Cancer  This type of cancer can be detected and often prevented.  Routine colorectal cancer screening usually begins at 21 years of age and continues through 21 years of age.  Your health care provider may recommend screening at an earlier age if you have risk factors for colon cancer.  Your health care provider may also recommend using home test kits to check for hidden blood in the stool.  A small camera at the end of a tube can be used to examine your colon directly (sigmoidoscopy or colonoscopy). This is done to check for the earliest forms of colorectal cancer.  Routine  screening usually begins at age 41.  Direct examination of the colon should be repeated every 5-10 years through 21 years of age. However, you may need to be screened more often if early forms of precancerous polyps or small growths are found. Skin Cancer  Check your skin from head to toe regularly.  Tell your health care provider about any new moles or changes in moles, especially if there is a change in a mole's shape or color.  Also tell your health care provider if you have a mole that is larger than the size of a pencil eraser.  Always use sunscreen. Apply sunscreen liberally and repeatedly throughout the day.  Protect yourself by wearing long sleeves, pants, a wide-brimmed hat, and sunglasses whenever you are outside. Heart disease, diabetes, and high blood pressure  High blood pressure causes heart disease and increases the risk of stroke. High blood pressure is more likely to develop in:  People who have blood pressure in the high end of the normal range (130-139/85-89 mm Hg).  People who are overweight or obese.  People who are African American.  If you are 59-24 years of age, have your blood pressure checked every 3-5 years. If you are 34 years of age or older, have your blood pressure checked every year. You should have your blood pressure measured twice-once when you are at a hospital or clinic, and once when you are not at a hospital or clinic. Record the average of the two measurements. To check your blood pressure when you are not at a hospital or clinic, you can use:  An automated blood pressure machine at a pharmacy.  A home blood pressure monitor.  If you are between 29 years and 60 years old, ask your health care provider if you should take aspirin to prevent strokes.  Have regular diabetes screenings. This involves taking a blood sample to check your fasting blood sugar level.  If you are at a normal weight and have a low risk for diabetes, have this test once  every three years after 21 years of age.  If you are overweight and have a high risk for diabetes, consider being tested at a younger age or more often. Preventing infection Hepatitis B  If you have a higher risk for hepatitis B, you should be screened for this virus. You are considered at high risk for hepatitis B if:  You were born in a country where hepatitis B is common. Ask your health care provider which countries are considered high risk.  Your parents were born in a high-risk country, and you have not been immunized against hepatitis B (hepatitis B vaccine).  You have HIV or AIDS.  You use needles to inject street drugs.  You live with someone who has hepatitis B.  You have had sex with someone who has hepatitis B.  You get hemodialysis treatment.  You take certain medicines for conditions, including cancer, organ transplantation, and autoimmune conditions. Hepatitis C  Blood testing is recommended for:  Everyone born from [birth year] through 1965.  Anyone with known risk factors for hepatitis C. Sexually transmitted infections (STIs)  You should be screened for sexually transmitted infections (STIs) including gonorrhea and chlamydia if:  You are sexually active and are younger than 21 years of age.  You are older than 21 years of age and your health care provider tells you that you are at risk for this type of infection.  Your sexual activity has changed since you were last screened and you are at an increased risk for chlamydia or gonorrhea. Ask your health care provider if you are at risk.  If you do not have HIV, but are at risk, it may be recommended that you take a prescription medicine daily to prevent HIV infection. This is called pre-exposure prophylaxis (PrEP). You are considered at risk if:  You are sexually active and do not regularly use condoms or know the HIV status of your partner(s).  You take drugs by injection.  You are sexually active with a partner  who has HIV. Talk with your health care provider about whether you are at high risk of being infected with HIV. If you choose to begin PrEP, you should first be tested for HIV. You should then be tested every 3 months for as long as you are taking PrEP. Pregnancy  If you are premenopausal and you may become pregnant, ask your health care provider about preconception counseling.  If you may become pregnant, take 400 to 800 micrograms (mcg) of folic acid  every day.  If you want to prevent pregnancy, talk to your health care provider about birth control (contraception). Osteoporosis and menopause  Osteoporosis is a disease in which the bones lose minerals and strength with aging. This can result in serious bone fractures. Your risk for osteoporosis can be identified using a bone density scan.  If you are 4 years of age or older, or if you are at risk for osteoporosis and fractures, ask your health care provider if you should be screened.  Ask your health care provider whether you should take a calcium or vitamin D supplement to lower your risk for osteoporosis.  Menopause may have certain physical symptoms and risks.  Hormone replacement therapy may reduce some of these symptoms and risks. Talk to your health care provider about whether hormone replacement therapy is right for you. Follow these instructions at home:  Schedule regular health, dental, and eye exams.  Stay current with your immunizations.  Do not use any tobacco products including cigarettes, chewing tobacco, or electronic cigarettes.  If you are pregnant, do not drink alcohol.  If you are breastfeeding, limit how much and how often you drink alcohol.  Limit alcohol intake to no more than 1 drink per day for nonpregnant women. One drink equals 12 ounces of beer, 5 ounces of wine, or 1 ounces of hard liquor.  Do not use street drugs.  Do not share needles.  Ask your health care provider for help if you need support  or information about quitting drugs.  Tell your health care provider if you often feel depressed.  Tell your health care provider if you have ever been abused or do not feel safe at home. This information is not intended to replace advice given to you by your health care provider. Make sure you discuss any questions you have with your health care provider. Document Released: 02/18/2011 Document Revised: 01/11/2016 Document Reviewed: 05/09/2015 Elsevier Interactive Patient Education  2017 [employer].

## 2017-01-03 LAB — COMPREHENSIVE METABOLIC PANEL
ALT: 22 U/L (ref 0–35)
AST: 16 U/L (ref 0–37)
Albumin: 4.3 g/dL (ref 3.5–5.2)
Alkaline Phosphatase: 50 U/L (ref 39–117)
BILIRUBIN TOTAL: 0.3 mg/dL (ref 0.2–1.2)
BUN: 13 mg/dL (ref 6–23)
CO2: 25 meq/L (ref 19–32)
CREATININE: 0.71 mg/dL (ref 0.40–1.20)
Calcium: 9.8 mg/dL (ref 8.4–10.5)
Chloride: 105 mEq/L (ref 96–112)
GFR: 110.17 mL/min (ref >60.00)
GLUCOSE: 87 mg/dL (ref 70–99)
Potassium: 4.4 mEq/L (ref 3.5–5.1)
SODIUM: 140 meq/L (ref 135–145)
Total Protein: 7 g/dL (ref 6.0–8.3)

## 2017-01-03 LAB — LDL CHOLESTEROL, DIRECT: Direct LDL: 134 mg/dL

## 2017-01-03 LAB — LIPID PANEL
CHOL/HDL RATIO: 4
Cholesterol: 227 mg/dL — ABNORMAL HIGH (ref 0–200)
HDL: 51.1 mg/dL (ref >39.00)
NONHDL: 175.42
Triglycerides: 251 mg/dL — ABNORMAL HIGH (ref 0.0–149.0)
VLDL: 50.2 mg/dL — AB (ref 0.0–40.0)

## 2017-01-03 LAB — VITAMIN B12: VITAMIN B 12: 197 pg/mL — AB (ref 211–911)

## 2017-01-03 LAB — HEMOGLOBIN A1C: Hgb A1c MFr Bld: 5.3 % (ref 4.6–6.5)

## 2017-01-03 LAB — CBC
HCT: 40.2 % (ref 36.0–46.0)
Hemoglobin: 13.7 g/dL (ref 12.0–15.0)
MCHC: 34.1 g/dL (ref 30.0–36.0)
MCV: 87.4 fl (ref 78.0–100.0)
Platelets: 342 10*3/uL (ref 150.0–400.0)
RBC: 4.61 Mil/uL (ref 3.87–5.11)
RDW: 13.2 % (ref 11.5–15.5)
WBC: 11 10*3/uL — ABNORMAL HIGH (ref 4.0–10.5)

## 2017-01-03 LAB — TSH: TSH: 4.4 u[IU]/mL (ref 0.35–4.50)

## 2017-01-03 LAB — VITAMIN D 25 HYDROXY (VIT D DEFICIENCY, FRACTURES): VITD: 32 ng/mL (ref 30.00–100.00)

## 2017-01-06 ENCOUNTER — Telehealth: Payer: Self-pay | Admitting: Internal Medicine

## 2017-01-06 MED ORDER — ATORVASTATIN CALCIUM 10 MG PO TABS: 10.0000 mg | ORAL_TABLET | Freq: Every day | ORAL | 2 refills | Status: DC

## 2017-01-06 NOTE — Telephone Encounter (Signed)
Pt wants to start b12 injections

## 2017-01-06 NOTE — Addendum Note (Signed)
Addended by: Lorre MunroeBAITY, Miyonna Ormiston W on: 01/06/2017 11:17 AM   Modules accepted: Orders

## 2017-01-09 ENCOUNTER — Ambulatory Visit (INDEPENDENT_AMBULATORY_CARE_PROVIDER_SITE_OTHER): Payer: PRIVATE HEALTH INSURANCE | Admitting: *Deleted

## 2017-01-09 DIAGNOSIS — D519 Vitamin B12 deficiency anemia, unspecified: Secondary | ICD-10-CM

## 2017-01-09 MED ORDER — CYANOCOBALAMIN 1000 MCG/ML IJ SOLN
1000.0000 ug | INTRAMUSCULAR | Status: DC
  Administered 2017-01-09: 1000 ug via INTRAMUSCULAR

## 2017-01-21 ENCOUNTER — Encounter: Payer: Self-pay | Admitting: Family Medicine

## 2017-01-21 ENCOUNTER — Ambulatory Visit (INDEPENDENT_AMBULATORY_CARE_PROVIDER_SITE_OTHER): Payer: PRIVATE HEALTH INSURANCE | Admitting: Family Medicine

## 2017-01-21 VITALS — BP 133/87 | HR 97 | Resp 18 | Ht 64.0 in | Wt 221.0 lb

## 2017-01-21 DIAGNOSIS — Z124 Encounter for screening for malignant neoplasm of cervix: Secondary | ICD-10-CM

## 2017-01-21 DIAGNOSIS — Z01419 Encounter for gynecological examination (general) (routine) without abnormal findings: Secondary | ICD-10-CM | POA: Diagnosis not present

## 2017-01-21 DIAGNOSIS — E282 Polycystic ovarian syndrome: Secondary | ICD-10-CM

## 2017-01-21 DIAGNOSIS — Z113 Encounter for screening for infections with a predominantly sexual mode of transmission: Secondary | ICD-10-CM | POA: Diagnosis not present

## 2017-01-21 DIAGNOSIS — L680 Hirsutism: Secondary | ICD-10-CM | POA: Insufficient documentation

## 2017-01-21 MED ORDER — SPIRONOLACTONE 25 MG PO TABS: 25.0000 mg | ORAL_TABLET | Freq: Every day | ORAL | 2 refills | Status: DC

## 2017-01-21 NOTE — Patient Instructions (Signed)
Preventive Care 18-39 Years, Female Preventive care refers to lifestyle choices and visits with your health care provider that can promote health and wellness. What does preventive care include?  A yearly physical exam. This is also called an annual well check.  Dental exams once or twice a year.  Routine eye exams. Ask your health care provider how often you should have your eyes checked.  Personal lifestyle choices, including: ? Daily care of your teeth and gums. ? Regular physical activity. ? Eating a healthy diet. ? Avoiding tobacco and drug use. ? Limiting alcohol use. ? Practicing safe sex. ? Taking vitamin and mineral supplements as recommended by your health care provider. What happens during an annual well check? The services and screenings done by your health care provider during your annual well check will depend on your age, overall health, lifestyle risk factors, and family history of disease. Counseling Your health care provider may ask you questions about your:  Alcohol use.  Tobacco use.  Drug use.  Emotional well-being.  Home and relationship well-being.  Sexual activity.  Eating habits.  Work and work Statistician.  Method of birth control.  Menstrual cycle.  Pregnancy history.  Screening You may have the following tests or measurements:  Height, weight, and BMI.  Diabetes screening. This is done by checking your blood sugar (glucose) after you have not eaten for a while (fasting).  Blood pressure.  Lipid and cholesterol levels. These may be checked every 5 years starting at age 66.  Skin check.  Hepatitis C blood test.  Hepatitis B blood test.  Sexually transmitted disease (STD) testing.  BRCA-related cancer screening. This may be done if you have a family history of breast, ovarian, tubal, or peritoneal cancers.  Pelvic exam and Pap test. This may be done every 3 years starting at age 40. Starting at age 59, this may be done every 5  years if you have a Pap test in combination with an HPV test.  Discuss your test results, treatment options, and if necessary, the need for more tests with your health care provider. Vaccines Your health care provider may recommend certain vaccines, such as:  Influenza vaccine. This is recommended every year.  Tetanus, diphtheria, and acellular pertussis (Tdap, Td) vaccine. You may need a Td booster every 10 years.  Varicella vaccine. You may need this if you have not been vaccinated.  HPV vaccine. If you are 69 or younger, you may need three doses over 6 months.  Measles, mumps, and rubella (MMR) vaccine. You may need at least one dose of MMR. You may also need a second dose.  Pneumococcal 13-valent conjugate (PCV13) vaccine. You may need this if you have certain conditions and were not previously vaccinated.  Pneumococcal polysaccharide (PPSV23) vaccine. You may need one or two doses if you smoke cigarettes or if you have certain conditions.  Meningococcal vaccine. One dose is recommended if you are age 27-21 years and a first-year college student living in a residence hall, or if you have one of several medical conditions. You may also need additional booster doses.  Hepatitis A vaccine. You may need this if you have certain conditions or if you travel or work in places where you may be exposed to hepatitis A.  Hepatitis B vaccine. You may need this if you have certain conditions or if you travel or work in places where you may be exposed to hepatitis B.  Haemophilus influenzae type b (Hib) vaccine. You may need this if  you have certain risk factors.  Talk to your health care provider about which screenings and vaccines you need and how often you need them. This information is not intended to replace advice given to you by your health care provider. Make sure you discuss any questions you have with your health care provider. Document Released: 10/01/2001 Document Revised: 04/24/2016  Document Reviewed: 06/06/2015 Elsevier Interactive Patient Education  2017 [employer].

## 2017-01-21 NOTE — Progress Notes (Signed)
   Subjective:     Carmen Reid is a 21 y.o. female and is here for a comprehensive physical exam. The patient reports problems - pelvic pain. Pain is sharp and it is happening 2 wks prior to her cycle and lasts 5-10 minutes. Pain is on both sides. Then has lower back pain.  Social History   Social History  . Marital status: Single    Spouse name: N/A  . Number of children: 0  . Years of education: HS   Occupational History  . [employer]    Social History Main Topics  . Smoking status: Never Smoker  . Smokeless tobacco: Never Used  . Alcohol use 0.0 oz/week     Comment: occasional  . Drug use: No  . Sexual activity: Yes    Birth control/ protection: Pill   Other Topics Concern  . Not on file   Social History Narrative   Occasional caffeine use    Health Maintenance  Topic Date Due  . HIV Screening  10/01/2010  . TETANUS/TDAP  10/01/2014  . CHLAMYDIA SCREENING  07/02/2015  . PAP SMEAR  10/01/2016  . INFLUENZA VACCINE  03/19/2017    The following portions of the patient's history were reviewed and updated as appropriate: allergies, current medications, past family history, past medical history, past social history, past surgical history and problem list.  Review of Systems Pertinent items noted in HPI and remainder of comprehensive ROS otherwise negative.   Objective:    BP 133/87 (BP Location: Left Arm, Patient Position: Sitting, Cuff Size: Large)   Pulse 97   Resp 18   Ht 5\' 4"  (1.626 m)   Wt 221 lb (100.2 kg)   LMP 12/27/2016   BMI 37.93 kg/m  General appearance: alert, cooperative and appears stated age Head: Normocephalic, without obvious abnormality, atraumatic Lungs: normal effort Heart: regular rate and rhythm Abdomen: soft, non-tender; bowel sounds normal; no masses,  no organomegaly Pelvic: cervix normal in appearance, external genitalia normal, no adnexal masses or tenderness, no cervical motion tenderness, uterus normal size,  shape, and consistency and vagina normal without discharge Extremities: extremities normal, atraumatic, no cyanosis or edema Skin: lots of abnormal hair growth    Assessment:    Healthy female exam.      Plan:   Problem List Items Addressed This Visit      Unprioritized   PCOS (polycystic ovarian syndrome)    Lengthy discussion had with pt. to explain PCOS, diagrams and verbal communication used to show impact on ovaries, endometrium, need for endometrial protection, impact on fertility, medical treatments to achieve necessary endpoints.  Should be on cyclical progestin vs. OC's if not trying to get pregnant.  Should be on Femara or clomid if trying to conceive.       Relevant Medications   spironolactone (ALDACTONE) 25 MG tablet   Hirsutism   Relevant Medications   spironolactone (ALDACTONE) 25 MG tablet    Other Visit Diagnoses    Well woman exam with routine gynecological exam    -  Primary   Screening for malignant neoplasm of cervix       Relevant Orders   Cytology - PAP   Encounter for gynecological examination without abnormal finding       Screen for STD (sexually transmitted disease)       Relevant Orders   Cytology - PAP        See After Visit Summary for Counseling Recommendations

## 2017-01-21 NOTE — Assessment & Plan Note (Signed)
Lengthy discussion had with pt. to explain PCOS, diagrams and verbal communication used to show impact on ovaries, endometrium, need for endometrial protection, impact on fertility, medical treatments to achieve necessary endpoints.  Should be on cyclical progestin vs. OC's if not trying to get pregnant.  Should be on Femara or clomid if trying to conceive.  

## 2017-01-24 LAB — CYTOLOGY - PAP
Adequacy: ABSENT
CHLAMYDIA, DNA PROBE: NEGATIVE
DIAGNOSIS: NEGATIVE
Neisseria Gonorrhea: NEGATIVE

## 2017-02-08 ENCOUNTER — Encounter: Payer: Self-pay | Admitting: Family Medicine

## 2017-02-08 ENCOUNTER — Ambulatory Visit (INDEPENDENT_AMBULATORY_CARE_PROVIDER_SITE_OTHER): Payer: BLUE CROSS/BLUE SHIELD | Admitting: Family Medicine

## 2017-02-08 VITALS — BP 118/76 | HR 81 | Temp 98.5°F | Wt 217.5 lb

## 2017-02-08 DIAGNOSIS — J029 Acute pharyngitis, unspecified: Secondary | ICD-10-CM | POA: Insufficient documentation

## 2017-02-08 LAB — POCT RAPID STREP A (OFFICE): Rapid Strep A Screen: POSITIVE — AB

## 2017-02-08 MED ORDER — PENICILLIN V POTASSIUM 500 MG PO TABS: 500.0000 mg | ORAL_TABLET | Freq: Two times a day (BID) | ORAL | 0 refills | Status: DC

## 2017-02-08 NOTE — Patient Instructions (Addendum)
Nice to meet you. You can use saltwater gargles and Tylenol or ibuprofen for your throat discomfort. You have strep throat. We will treat with penicillin.  Please monitor for worsening symptoms or fever and if they occur please be reevaluated.

## 2017-02-08 NOTE — Assessment & Plan Note (Signed)
Patient with positive rapid strep test in the setting of isolated sore throat. Discussed treatment with penicillin. This is prescribed. She is given return precautions.

## 2017-02-08 NOTE — Progress Notes (Signed)
  Marikay AlarEric Taran Haynesworth, MD Phone: [phone]  Carmen Reid Elizabeth Carmen Reid is a 21 y.o. female who presents today for same-day visit.  Patient reports sore throat for 2 days. She's had no fevers. No postnasal drip. No upper respiratory congestion. No cough. She has a history of a tonsillectomy and adenoidectomy in the past for issues with breathing when she was younger. She has sick contacts with her boyfriend had similar symptoms that improved on their own. She has not tried anything for her symptoms.  PMH: nonsmoker.   ROS see history of present illness  Objective  Physical Exam Vitals:   02/08/17 1144  BP: 118/76  Pulse: 81  Temp: 98.5 F (36.9 C)    BP Readings from Last 3 Encounters:  02/08/17 118/76  01/21/17 133/87  01/02/17 116/74   Wt Readings from Last 3 Encounters:  02/08/17 217 lb 8 oz (98.7 kg)  01/21/17 221 lb (100.2 kg)  01/02/17 218 lb (98.9 kg)    Physical Exam  Constitutional: No distress.  HENT:  Head: Normocephalic and atraumatic.  Mild posterior oropharyngeal erythema, no exudate, normal TMs  Eyes: Conjunctivae are normal. Pupils are equal, round, and reactive to light.  Neck: Neck supple.  Cardiovascular: Normal rate, regular rhythm and normal heart sounds.   Pulmonary/Chest: Effort normal and breath sounds normal.  Musculoskeletal: She exhibits no edema.  Lymphadenopathy:    She has no cervical adenopathy.  Neurological: She is alert. Gait normal.  Skin: She is not diaphoretic.     Assessment/Plan: Please see individual problem list.  Sore throat Patient with positive rapid strep test in the setting of isolated sore throat. Discussed treatment with penicillin. This is prescribed. She is given return precautions.   Orders Placed This Encounter  Procedures  . POCT rapid strep A    Meds ordered this encounter  Medications  . penicillin v potassium (VEETID) 500 MG tablet    Sig: Take 1 tablet (500 mg total) by mouth 2 (two) times daily.      Dispense:  20 tablet    Refill:  0   Marikay AlarEric Miguel Christiana, MD [facility]

## 2017-02-12 ENCOUNTER — Ambulatory Visit: Payer: PRIVATE HEALTH INSURANCE

## 2017-02-13 ENCOUNTER — Encounter: Payer: Self-pay | Admitting: Internal Medicine

## 2017-02-13 ENCOUNTER — Ambulatory Visit (INDEPENDENT_AMBULATORY_CARE_PROVIDER_SITE_OTHER): Payer: BLUE CROSS/BLUE SHIELD | Admitting: Internal Medicine

## 2017-02-13 VITALS — BP 132/88 | HR 103 | Temp 98.3°F | Resp 16 | Ht 64.0 in | Wt 217.8 lb

## 2017-02-13 DIAGNOSIS — J301 Allergic rhinitis due to pollen: Secondary | ICD-10-CM | POA: Diagnosis not present

## 2017-02-13 DIAGNOSIS — E538 Deficiency of other specified B group vitamins: Secondary | ICD-10-CM

## 2017-02-13 DIAGNOSIS — J020 Streptococcal pharyngitis: Secondary | ICD-10-CM | POA: Diagnosis not present

## 2017-02-13 LAB — POCT RAPID STREP A (OFFICE): Rapid Strep A Screen: NEGATIVE

## 2017-02-13 MED ORDER — CYANOCOBALAMIN 1000 MCG/ML IJ SOLN
1000.0000 ug | Freq: Once | INTRAMUSCULAR | Status: AC
  Administered 2017-02-13: 1000 ug via INTRAMUSCULAR

## 2017-02-13 NOTE — Patient Instructions (Signed)
Allergic Rhinitis Allergic rhinitis is when the mucous membranes in the nose respond to allergens. Allergens are particles in the air that cause your body to have an allergic reaction. This causes you to release allergic antibodies. Through a chain of events, these eventually cause you to release histamine into the blood stream. Although meant to protect the body, it is this release of histamine that causes your discomfort, such as frequent sneezing, congestion, and an itchy, runny nose. What are the causes? Seasonal allergic rhinitis (hay fever) is caused by pollen allergens that may come from grasses, trees, and weeds. Year-round allergic rhinitis (perennial allergic rhinitis) is caused by allergens such as house dust mites, pet dander, and mold spores. What are the signs or symptoms?  Nasal stuffiness (congestion).  Itchy, runny nose with sneezing and tearing of the eyes. How is this diagnosed? Your health care provider can help you determine the allergen or allergens that trigger your symptoms. If you and your health care provider are unable to determine the allergen, skin or blood testing may be used. Your health care provider will diagnose your condition after taking your health history and performing a physical exam. Your health care provider may assess you for other related conditions, such as asthma, pink eye, or an ear infection. How is this treated? Allergic rhinitis does not have a cure, but it can be controlled by:  Medicines that block allergy symptoms. These may include allergy shots, nasal sprays, and oral antihistamines.  Avoiding the allergen. Hay fever may often be treated with antihistamines in pill or nasal spray forms. Antihistamines block the effects of histamine. There are over-the-counter medicines that may help with nasal congestion and swelling around the eyes. Check with your health care provider before taking or giving this medicine. If avoiding the allergen or the  medicine prescribed do not work, there are many new medicines your health care provider can prescribe. Stronger medicine may be used if initial measures are ineffective. Desensitizing injections can be used if medicine and avoidance does not work. Desensitization is when a patient is given ongoing shots until the body becomes less sensitive to the allergen. Make sure you follow up with your health care provider if problems continue. Follow these instructions at home: It is not possible to completely avoid allergens, but you can reduce your symptoms by taking steps to limit your exposure to them. It helps to know exactly what you are allergic to so that you can avoid your specific triggers. Contact a health care provider if:  You have a fever.  You develop a cough that does not stop easily (persistent).  You have shortness of breath.  You start wheezing.  Symptoms interfere with normal daily activities. This information is not intended to replace advice given to you by your health care provider. Make sure you discuss any questions you have with your health care provider. Document Released: 04/30/2001 Document Revised: 04/05/2016 Document Reviewed: 04/12/2013 Elsevier Interactive Patient Education  2017 Else[employer] Inc.  

## 2017-02-13 NOTE — Addendum Note (Signed)
Addended by: Osa CraverGUIJOZA, Chynna Buerkle M on: 02/13/2017 03:52 PM   Modules accepted: Orders

## 2017-02-13 NOTE — Progress Notes (Signed)
Subjective:    Patient ID: Carmen Reid, female    DOB: Jan 04, 1996, 21 y.o.   MRN: [id number]  HPI  Pt presents to the clinic today to follow up strep throat. She saw Dr. Birdie Sons 6/23 for the same. Rapid strep was positive. She was treated with Penicillin x 10 days. She reports she is still currently taking the antibiotics as prescribed. She reports her throat is much better but now she has a headache, ear fullness, runny nose and nasal congestion. She denies fever, chills or body aches. She has tried Excedrin with minimal relief. She has no history of seasonal allergies. She has not had sick contacts that she is aware of.  She would also like her B12 shot today.  Review of Systems      Past Medical History:  Diagnosis Date  . Anxiety   . Depression   . High cholesterol    Not on medication  . Ovarian cyst   . PCOS (polycystic ovarian syndrome)   . 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 atorvastatin (LIPITOR) 10 MG tablet Take 1 tablet (10 mg total) by mouth daily. 30 tablet 2  . norgestimate-ethinyl estradiol (SPRINTEC 28) 0.25-35 MG-MCG tablet Take 1 tablet by mouth daily. 1 Package 11  . Omega-3 Fatty Acids (FISH OIL) 1000 MG CAPS Take 1 capsule by mouth 3 (three) times daily.    . penicillin v potassium (VEETID) 500 MG tablet Take 1 tablet (500 mg total) by mouth 2 (two) times daily. 20 tablet 0  . spironolactone (ALDACTONE) 25 MG tablet Take 1 tablet (25 mg total) by mouth daily. 30 tablet 2   Current Facility-Administered Medications  Medication Dose Route Frequency Provider Last Rate Last Dose  . cyanocobalamin ((VITAMIN B-12)) injection 1,000 mcg  1,000 mcg Intramuscular Q30 days Lorre Munroe, NP   1,000 mcg at 01/09/17 1518    Allergies  Allergen Reactions  . Latex Swelling  . Warfarin And Related Dermatitis    Family History    Problem Relation Age of Onset  . Cancer Maternal Grandmother 79       breast cancer/skin cancer  . Heart disease Maternal Grandmother   . Diabetes Maternal Grandmother   . Kidney disease Maternal Grandmother   . Hyperlipidemia Maternal Grandmother   . Hypertension Maternal Grandmother   . Arthritis Maternal Grandmother   . Mental illness Maternal Grandmother   . Heart disease Maternal Grandfather   . Cancer Maternal Grandfather 32       prostate cancer  . Diabetes Maternal Grandfather   . Hyperlipidemia Maternal Grandfather   . Mental illness Maternal Grandfather   . Cancer Maternal Uncle        liver cancer  . Kidney disease Maternal Uncle   . Mental illness Paternal Grandmother   . Hypertension Paternal Grandmother   . Hypertension Mother   . Mental illness Mother   . Arthritis Father   . Hyperlipidemia Father   . Heart disease Father   . Stroke Maternal Aunt   . Heart disease Paternal Uncle   . Cancer Maternal Uncle        Colon    Social History   Social History  . Marital status: Single    Spouse name: N/A  . Number of children: 0  . Years of education: HS   Occupational History  . [employer]  Social History Main Topics  . Smoking status: Never Smoker  . Smokeless tobacco: Never Used  . Alcohol use 0.0 oz/week     Comment: occasional  . Drug use: No  . Sexual activity: Yes    Birth control/ protection: Pill   Other Topics Concern  . Not on file   Social History Narrative   Occasional caffeine use      Constitutional: Pt reports headache. Denies fever, malaise, fatigue, or abrupt weight changes.  HEENT: Pt reports ear fullness, runny nose, nasal congestion. Denies eye pain, eye redness, ear pain, ringing in the ears, wax buildup, bloody nose, or sore throat. Respiratory: Denies difficulty breathing, shortness of breath, cough or sputum production.    No other specific complaints in a complete review of systems (except as listed in HPI  above).  Objective:   Physical Exam   BP 132/88   Pulse (!) 103   Temp 98.3 F (36.8 C) (Oral)   Resp 16   Ht 5\' 4"  (1.626 m)   Wt 217 lb 12.8 oz (98.8 kg)   LMP 01/15/2017   SpO2 98%   BMI 37.39 kg/m  Wt Readings from Last 3 Encounters:  02/13/17 217 lb 12.8 oz (98.8 kg)  02/08/17 217 lb 8 oz (98.7 kg)  01/21/17 221 lb (100.2 kg)    General: Appears her stated age, obese in NAD. HEENT: Head: normal shape and size, no sinus tenderness noted; Ears: Tm's gray and intact, normal light reflex, + serous effusion bilaterally; Nose: mucosa boggy and moist, turbinates swollen; Throat/Mouth: Teeth present, mucosa pink and moist, +PND,  no exudate, lesions or ulcerations noted.  Neck:  Bilateral anterior cervical adenopathy noted.   Cardiovascular: Tachycardic with normal rhythm.  Pulmonary/Chest: Normal effort and positive vesicular breath sounds. No respiratory distress. No wheezes, rales or ronchi noted.   BMET    Component Value Date/Time   NA 140 01/02/2017 1611   NA 133 06/21/2013 1436   K 4.4 01/02/2017 1611   K 3.3 06/21/2013 1436   CL 105 01/02/2017 1611   CL 100 06/21/2013 1436   CO2 25 01/02/2017 1611   CO2 24 06/21/2013 1436   GLUCOSE 87 01/02/2017 1611   GLUCOSE 108 (H) 06/21/2013 1436   BUN 13 01/02/2017 1611   BUN 5 (L) 06/21/2013 1436   CREATININE 0.71 01/02/2017 1611   CREATININE 0.76 06/21/2013 1436   CALCIUM 9.8 01/02/2017 1611   CALCIUM 9.8 06/21/2013 1436    Lipid Panel     Component Value Date/Time   CHOL 227 (H) 01/02/2017 1611   TRIG 251.0 (H) 01/02/2017 1611   HDL 51.10 01/02/2017 1611   CHOLHDL 4 01/02/2017 1611   VLDL 50.2 (H) 01/02/2017 1611    CBC    Component Value Date/Time   WBC 11.0 (H) 01/02/2017 1611   RBC 4.61 01/02/2017 1611   HGB 13.7 01/02/2017 1611   HGB 15.1 06/21/2013 1436   HCT 40.2 01/02/2017 1611   HCT 42.6 06/21/2013 1436   PLT 342.0 01/02/2017 1611   PLT 275 06/21/2013 1436   MCV 87.4 01/02/2017 1611   MCV 87  06/21/2013 1436   MCH 30.0 05/18/2015 0853   MCHC 34.1 01/02/2017 1611   RDW 13.2 01/02/2017 1611   RDW 12.8 06/21/2013 1436    Hgb A1C Lab Results  Component Value Date   HGBA1C 5.3 01/02/2017           Assessment & Plan:   B12 Deficiency:  B12 shot today  Strep Throat:  RST: negative Finish Penicillin as prescribed  Allergic Rhinitis:  Start Zyrtec and Flonase OTC 80 mg Depo IM today  Return precautions discussed Nicki ReaperBAITY, Thaer Miyoshi, NP

## 2017-03-24 ENCOUNTER — Ambulatory Visit: Payer: PRIVATE HEALTH INSURANCE | Admitting: Internal Medicine

## 2017-04-05 IMAGING — CT CT ANGIO HEAD
3 of 9 series · 16 of 47 positions shown · IV contrast (omnipaque)
Comparison: None.

CLINICAL DATA: Non intractable episodic headache.  Blurred vision

EXAM:
CT ANGIOGRAPHY HEAD
TECHNIQUE: Multidetector CT imaging of the head was performed using the
standard protocol during bolus administration of intravenous
contrast. Multiplanar CT image reconstructions and MIPs were
obtained to evaluate the vascular anatomy.
CONTRAST:  80mL OMNIPAQUE IOHEXOL 350 MG/ML SOLN

[Series 8: cta head · axial · 0.44mm/px · z∈[-158,-31]mm · 10 of 157 slices shown]
[im 15/157  brain]
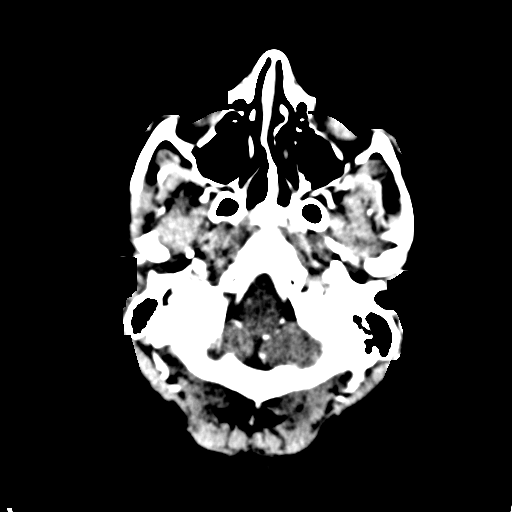
[im 29/157  bone]
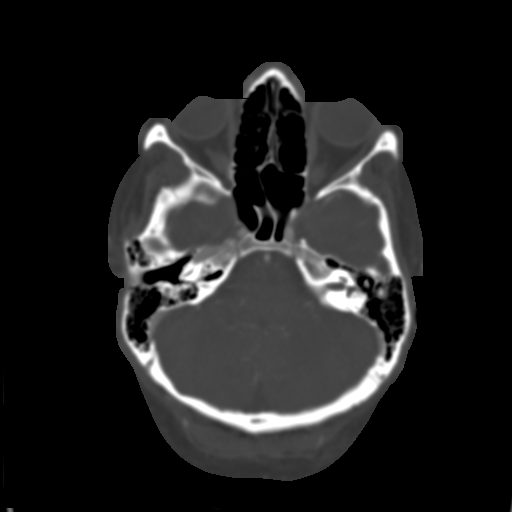
[im 43/157  brain]
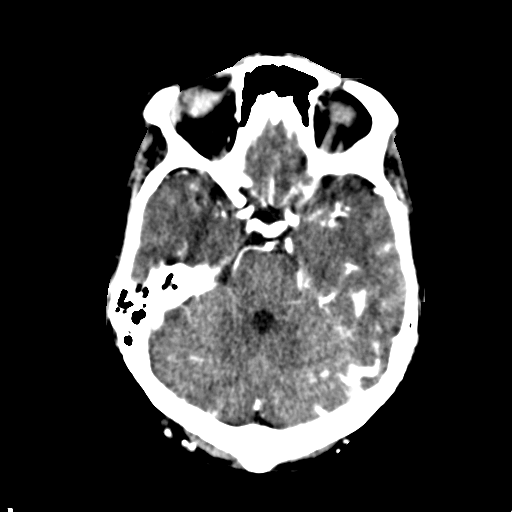
[im 57/157  bone]
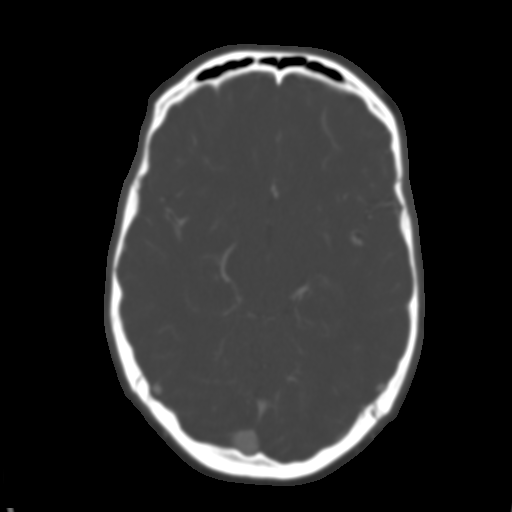
[im 71/157  brain]
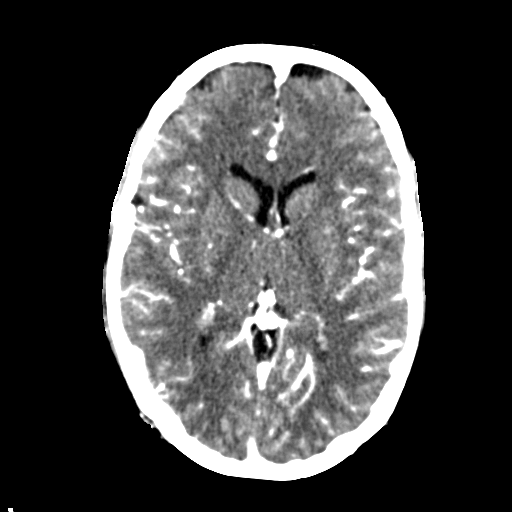
[im 86/157  bone]
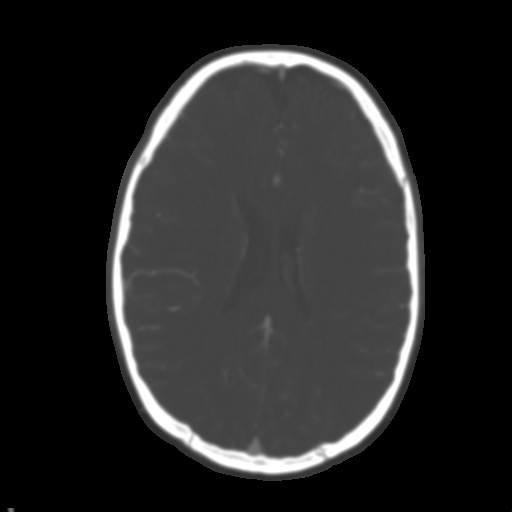
[im 100/157  brain]
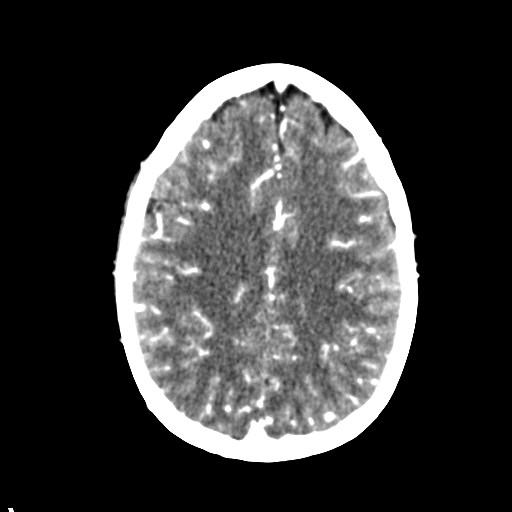
[im 114/157  bone]
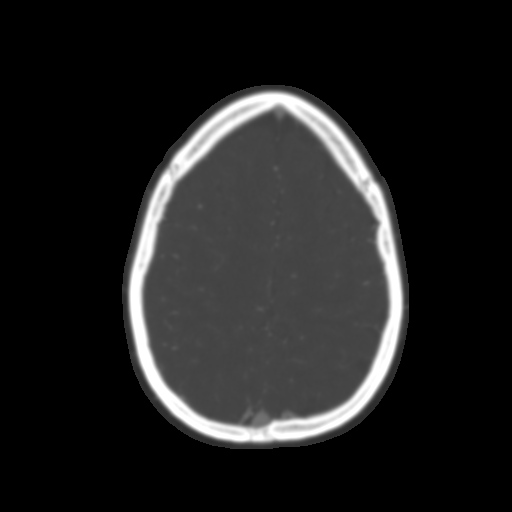
[im 128/157  brain]
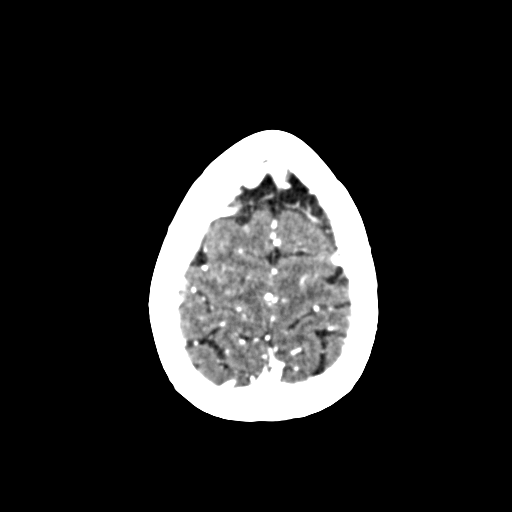
[im 142/157  bone]
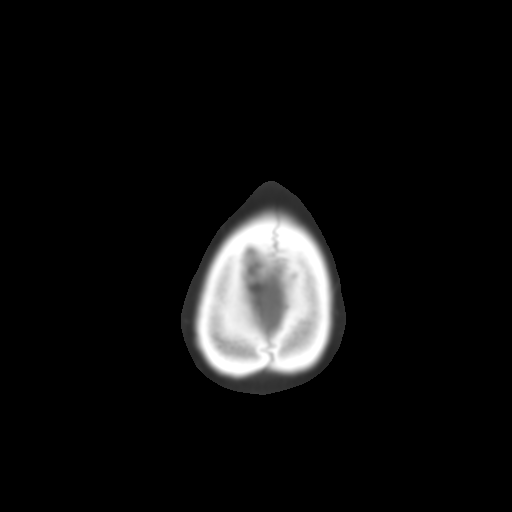

[Series 11: cor thin · coronal · 0.30mm/px · 3 of 193 slices shown]
[im 55/193  brain]
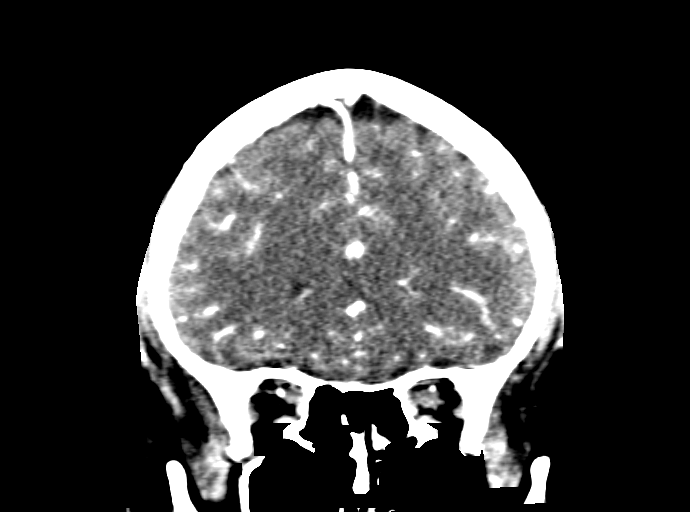
[im 83/193  brain]
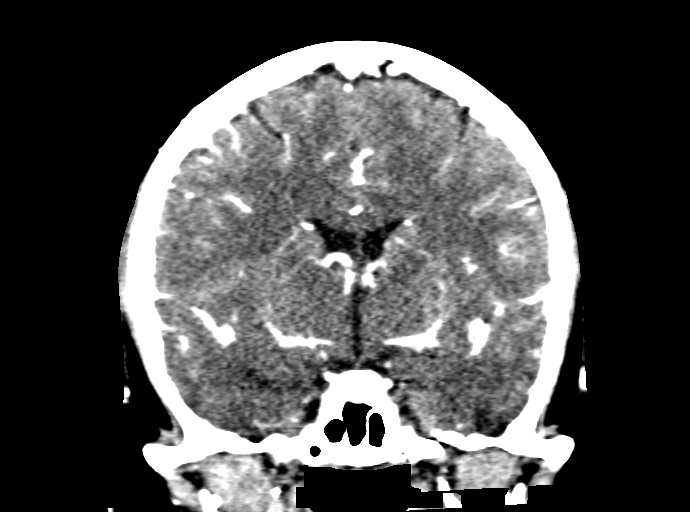
[im 110/193  brain]
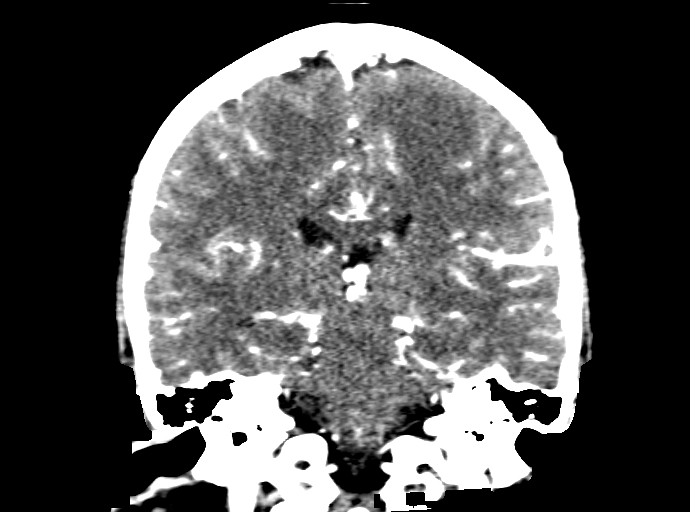

[Series 13: sag thin · sagittal · 0.33mm/px · 3 of 157 slices shown]
[im 32/157  brain]
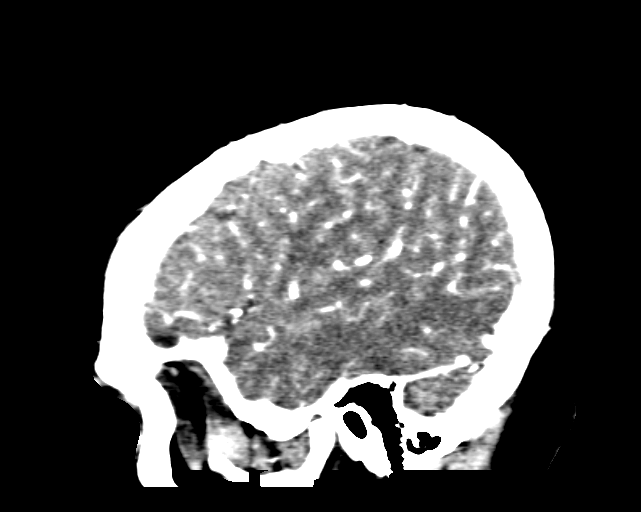
[im 63/157  brain]
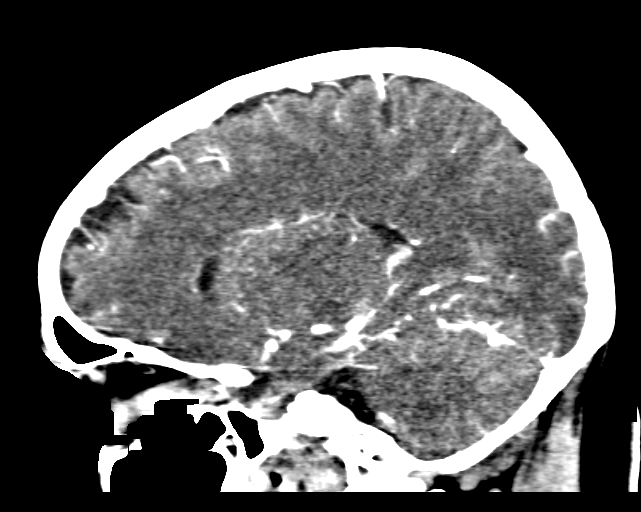
[im 94/157  brain]
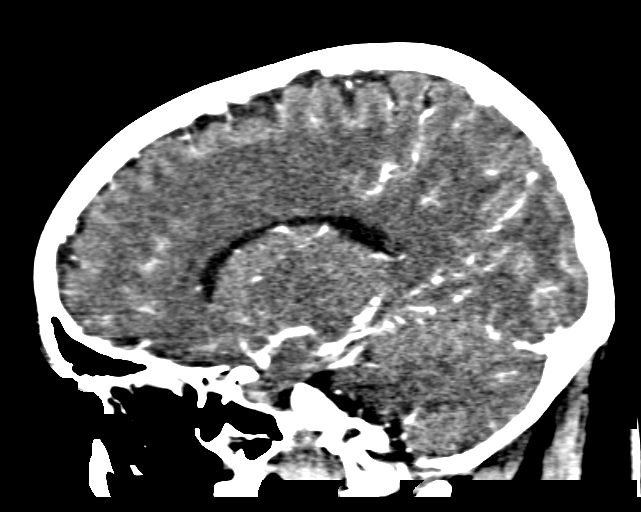

[16 of 47 positions shown; findings below may reference images not displayed]

FINDINGS: CT HEAD

Brain: Ventricle size normal. Negative for acute or chronic
infarction. Negative for hemorrhage or mass. No edema in the brain

Calvarium and skull base: Negative

Paranasal sinuses: Negative

Orbits: Normal

CTA HEAD

Anterior circulation: Cavernous carotid is widely patent and normal
appearing. No stenosis or aneurysm. Anterior and middle cerebral
arteries widely patent. Fetal origin left posterior cerebral artery.

Posterior circulation: Both vertebral arteries patent to the
basilar. Left vertebral dominant. PICA patent bilaterally. Basilar
widely patent. AICA visualized on the right. Superior cerebellar and
posterior cerebral arteries patent. Hypoplastic left P1 segment with
fetal origin of the left posterior cerebral artery.

Venous sinuses: Patent

Anatomic variants: Negative for aneurysm.

Delayed phase:No enhancing mass lesion. Small enhancing vessel right
frontal lobe compatible with developmental venous anomaly. This is
an incidental finding.
IMPRESSION: Normal CTA of the head

Incidental small developmental venous anomaly in the right frontal
lobe.

These results will be called to the ordering clinician or
representative by the Radiologist Assistant, and communication
documented in the PACS or zVision Dashboard.

## 2017-04-09 ENCOUNTER — Encounter: Payer: Self-pay | Admitting: Internal Medicine

## 2017-04-14 ENCOUNTER — Ambulatory Visit: Payer: PRIVATE HEALTH INSURANCE | Admitting: Internal Medicine

## 2017-07-07 ENCOUNTER — Ambulatory Visit: Payer: PRIVATE HEALTH INSURANCE | Admitting: Family Medicine

## 2017-07-07 ENCOUNTER — Ambulatory Visit: Payer: PRIVATE HEALTH INSURANCE | Admitting: Sports Medicine

## 2017-07-08 ENCOUNTER — Encounter: Payer: Self-pay | Admitting: Internal Medicine

## 2017-07-15 ENCOUNTER — Encounter: Payer: Self-pay | Admitting: Internal Medicine

## 2017-07-15 ENCOUNTER — Ambulatory Visit (INDEPENDENT_AMBULATORY_CARE_PROVIDER_SITE_OTHER): Payer: PRIVATE HEALTH INSURANCE | Admitting: Internal Medicine

## 2017-07-15 VITALS — BP 126/82 | HR 96 | Temp 98.3°F | Wt 213.0 lb

## 2017-07-15 DIAGNOSIS — L600 Ingrowing nail: Secondary | ICD-10-CM | POA: Diagnosis not present

## 2017-07-15 DIAGNOSIS — R200 Anesthesia of skin: Secondary | ICD-10-CM | POA: Diagnosis not present

## 2017-07-16 ENCOUNTER — Encounter: Payer: Self-pay | Admitting: Internal Medicine

## 2017-07-16 NOTE — Progress Notes (Signed)
Subjective:    Patient ID: Carmen Reid, female    DOB: [birth year], 21 y.o.   MRN: [id number]  HPI  Pt presents to the clinic today with c/o right great toe numbness. She reports that apporximately 1-2 months ago, she had an ingrown toenail. She let her father, cut the nail out, all the way down to the cuticle. Since that time, she has experienced numbness in her right great toe and first toe. She denies any current pain, redness, swelling or drainage. She has not tried anything OTC.  Review of Systems      Past Medical History:  Diagnosis Date  . Anxiety   . Depression   . High cholesterol    Not on medication  . Ovarian cyst   . PCOS (polycystic ovarian syndrome)   . 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 atorvastatin (LIPITOR) 10 MG tablet Take 1 tablet (10 mg total) by mouth daily. 30 tablet 2  . norgestimate-ethinyl estradiol (SPRINTEC 28) 0.25-35 MG-MCG tablet Take 1 tablet by mouth daily. 1 Package 11  . Omega-3 Fatty Acids (FISH OIL) 1000 MG CAPS Take 1 capsule by mouth 3 (three) times daily.    Marland Kitchen. spironolactone (ALDACTONE) 25 MG tablet Take 1 tablet (25 mg total) by mouth daily. 30 tablet 2   Current Facility-Administered Medications  Medication Dose Route Frequency Provider Last Rate Last Dose  . cyanocobalamin ((VITAMIN B-12)) injection 1,000 mcg  1,000 mcg Intramuscular Q30 days Lorre MunroeBaity, Jenniger Figiel W, NP   1,000 mcg at 01/09/17 1518    Allergies  Allergen Reactions  . Latex Swelling  . Warfarin And Related Dermatitis    Family History  Problem Relation Age of Onset  . Cancer Maternal Grandmother 2055       breast cancer/skin cancer  . Heart disease Maternal Grandmother   . Diabetes Maternal Grandmother   . Kidney disease Maternal Grandmother   . Hyperlipidemia Maternal Grandmother   . Hypertension Maternal Grandmother   . Arthritis Maternal  Grandmother   . Mental illness Maternal Grandmother   . Heart disease Maternal Grandfather   . Cancer Maternal Grandfather [id number]       prostate cancer  . Diabetes Maternal Grandfather   . Hyperlipidemia Maternal Grandfather   . Mental illness Maternal Grandfather   . Cancer Maternal Uncle        liver cancer  . Kidney disease Maternal Uncle   . Mental illness Paternal Grandmother   . Hypertension Paternal Grandmother   . Hypertension Mother   . Mental illness Mother   . Arthritis Father   . Hyperlipidemia Father   . Heart disease Father   . Stroke Maternal Aunt   . Heart disease Paternal Uncle   . Cancer Maternal Uncle        Colon    Social History   Socioeconomic History  . Marital status: Single    Spouse name: Not on file  . Number of children: 0  . Years of education: HS  . Highest education level: Not on file  Social Needs  . Financial resource strain: Not on file  . Food insecurity - worry: Not on file  . Food insecurity - inability: Not on file  . Transportation needs - medical: Not on file  . Transportation needs - non-medical: Not on file  Occupational History  . Occupation: [employer]  Tobacco  Use  . Smoking status: Never Smoker  . Smokeless tobacco: Never Used  Substance and Sexual Activity  . Alcohol use: Yes    Alcohol/week: 0.0 oz    Comment: occasional  . Drug use: No  . Sexual activity: Yes    Birth control/protection: Pill  Other Topics Concern  . Not on file  Social History Narrative   Occasional caffeine use      Constitutional: Denies fever, malaise, fatigue, headache or abrupt weight changes.  Skin: Denies redness, rashes, lesions or ulcercations.  Neurological: Pt reports numbness of great toe and first toe, right foot. Denies dizziness, difficulty with memory, difficulty with speech or problems with balance and coordination.    No other specific complaints in a complete review of systems (except as listed in HPI  above).  Objective:   Physical Exam  BP 126/82   Pulse 96   Temp 98.3 F (36.8 C) (Oral)   Wt 213 lb (96.6 kg)   LMP 07/15/2017   SpO2 98%   BMI 36.56 kg/m  Wt Readings from Last 3 Encounters:  07/15/17 213 lb (96.6 kg)  02/13/17 217 lb 12.8 oz (98.8 kg)  02/08/17 217 lb 8 oz (98.7 kg)    General: Appears her stated age, obese in NAD. Skin: Mild redness, but no swelling or drainage noted, lateral edge of right great toe. Decreased sensation of great toe and first toe, right foot. Normal proprioperception.  Cardiovascular: Right pedal pulse 2+, cap refill < 3 secs.   BMET    Component Value Date/Time   NA 140 01/02/2017 1611   NA 133 06/21/2013 1436   K 4.4 01/02/2017 1611   K 3.3 06/21/2013 1436   CL 105 01/02/2017 1611   CL 100 06/21/2013 1436   CO2 25 01/02/2017 1611   CO2 24 06/21/2013 1436   GLUCOSE 87 01/02/2017 1611   GLUCOSE 108 (H) 06/21/2013 1436   BUN 13 01/02/2017 1611   BUN 5 (L) 06/21/2013 1436   CREATININE 0.71 01/02/2017 1611   CREATININE 0.76 06/21/2013 1436   CALCIUM 9.8 01/02/2017 1611   CALCIUM 9.8 06/21/2013 1436    Lipid Panel     Component Value Date/Time   CHOL 227 (H) 01/02/2017 1611   TRIG 251.0 (H) 01/02/2017 1611   HDL 51.10 01/02/2017 1611   CHOLHDL 4 01/02/2017 1611   VLDL 50.2 (H) 01/02/2017 1611    CBC    Component Value Date/Time   WBC 11.0 (H) 01/02/2017 1611   RBC 4.61 01/02/2017 1611   HGB 13.7 01/02/2017 1611   HGB 15.1 06/21/2013 1436   HCT 40.2 01/02/2017 1611   HCT 42.6 06/21/2013 1436   PLT 342.0 01/02/2017 1611   PLT 275 06/21/2013 1436   MCV 87.4 01/02/2017 1611   MCV 87 06/21/2013 1436   MCH 30.0 05/18/2015 0853   MCHC 34.1 01/02/2017 1611   RDW 13.2 01/02/2017 1611   RDW 12.8 06/21/2013 1436    Hgb A1C Lab Results  Component Value Date   HGBA1C 5.3 01/02/2017            Assessment & Plan:   Ingrown Toenail:  Referral to podiatry for further evaluation Advised her to soak her foot in  Epsom salt  Numbness of Toes:  Likely r/t nerve damage from traumatic removal of nail No intervention at this time Will monitor for now  Return precautions discussed Nicki ReaperBAITY, Kymoni Lesperance, NP

## 2017-07-16 NOTE — Patient Instructions (Signed)
Ingrown Toenail An ingrown toenail occurs when the corner or sides of your toenail grow into the surrounding skin. The big toe is most commonly affected, but it can happen to any of your toes. If your ingrown toenail is not treated, you will be at risk for infection. What are the causes? This condition may be caused by:  Wearing shoes that are too small or tight.  Injury or trauma, such as stubbing your toe or having your toe stepped on.  Improper cutting or care of your toenails.  Being born with (congenital) nail or foot abnormalities, such as having a nail that is too big for your toe.  What increases the risk? Risk factors for an ingrown toenail include:  Age. Your nails tend to thicken as you get older, so ingrown nails are more common in older people.  Diabetes.  Cutting your toenails incorrectly.  Blood circulation problems.  What are the signs or symptoms? Symptoms may include:  Pain, soreness, or tenderness.  Redness.  Swelling.  Hardening of the skin surrounding the toe.  Your ingrown toenail may be infected if there is fluid, pus, or drainage. How is this diagnosed? An ingrown toenail may be diagnosed by medical history and physical exam. If your toenail is infected, your health care provider may test a sample of the drainage. How is this treated? Treatment depends on the severity of your ingrown toenail. Some ingrown toenails may be treated at home. More severe or infected ingrown toenails may require surgery to remove all or part of the nail. Infected ingrown toenails may also be treated with antibiotic medicines. Follow these instructions at home:  If you were prescribed an antibiotic medicine, finish all of it even if you start to feel better.  Soak your foot in warm soapy water for 20 minutes, 3 times per day or as directed by your health care provider.  Carefully lift the edge of the nail away from the sore skin by wedging a small piece of cotton under  the corner of the nail. This may help with the pain. Be careful not to cause more injury to the area.  Wear shoes that fit well. If your ingrown toenail is causing you pain, try wearing sandals, if possible.  Trim your toenails regularly and carefully. Do not cut them in a curved shape. Cut your toenails straight across. This prevents injury to the skin at the corners of the toenail.  Keep your feet clean and dry.  If you are having trouble walking and are given crutches by your health care provider, use them as directed.  Do not pick at your toenail or try to remove it yourself.  Take medicines only as directed by your health care provider.  Keep all follow-up visits as directed by your health care provider. This is important. Contact a health care provider if:  Your symptoms do not improve with treatment. Get help right away if:  You have red streaks that start at your foot and go up your leg.  You have a fever.  You have increased redness, swelling, or pain.  You have fluid, blood, or pus coming from your toenail. This information is not intended to replace advice given to you by your health care provider. Make sure you discuss any questions you have with your health care provider. Document Released: 08/02/2000 Document Revised: 01/05/2016 Document Reviewed: 06/29/2014 Elsevier Interactive Patient Education  2018 Elsevier Inc.  

## 2017-07-21 ENCOUNTER — Telehealth: Payer: Self-pay | Admitting: Radiology

## 2017-07-21 NOTE — Telephone Encounter (Signed)
Left message on the cell phone voicemail, patient requested appointment through mychart, called to schedule. Patient to call back to [email or web address].

## 2017-07-28 ENCOUNTER — Ambulatory Visit: Payer: BLUE CROSS/BLUE SHIELD | Admitting: Podiatry

## 2017-08-08 ENCOUNTER — Ambulatory Visit: Payer: Self-pay

## 2017-08-08 ENCOUNTER — Ambulatory Visit (INDEPENDENT_AMBULATORY_CARE_PROVIDER_SITE_OTHER): Payer: Self-pay | Admitting: Podiatry

## 2017-08-08 DIAGNOSIS — R600 Localized edema: Secondary | ICD-10-CM

## 2017-08-08 DIAGNOSIS — R202 Paresthesia of skin: Secondary | ICD-10-CM

## 2017-08-09 NOTE — Progress Notes (Signed)
   HPI: 21 year old female presents the office today for evaluation regarding loss of sensation to the right great toe with touch.  She also complains of chronic left lower extremity intermittent swelling.  Patient has no pain regarding the loss of sensation of the right great toe.  She experiences some numbness to touch around the first and second digit.  This been going for on for several months, approximately 5 months, when she injured her toe.  Patient also states that she has a long-standing history of intermittent left lower extremity leg swelling.  She wore compression stockings in the past which helped to alleviate symptoms.  Past Medical History:  Diagnosis Date  . Anxiety   . Depression   . High cholesterol    Not on medication  . Ovarian cyst   . PCOS (polycystic ovarian syndrome)   . 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  Dermatology: Skin is warm, dry and supple bilateral lower extremities. Negative for open lesions or macerations.  Vascular: Palpable pedal pulses bilaterally.  Negative for erythema.  Today there is a minimal amount of left lower extremity swelling extending proximally to the level of the calf.  Capillary refill within normal limits.  Neurological: Epicritic and protective threshold grossly intact bilaterally.  Paresthesia or lites of light touch sensation noted localized to the lateral aspect of the right great toe and medial aspect of the second digit first interdigital space.  Musculoskeletal Exam: Range of motion within normal limits to all pedal and ankle joints bilateral. Muscle strength 5/5 in all groups bilateral.   Radiographic Exam:  Normal osseous mineralization. Joint spaces preserved. No fracture/dislocation/boney destruction.    Assessment: -Paresthesia right great toe-nonpainful -Left lower extremity  swelling   Plan of Care:  -Patient was evaluated today.  X-rays reviewed today. -Currently the numbness to the right great toe does not bother the patient.  She states she does not even notice it is there until she touches the toe.  Recommend good supportive shoe gear and wide shoes.  I explained to her that this is likely a nerve pathology and etiology and it is hard to treat other than wide fitting shoes to alleviate any irritative pressure. -Prescription provided the patient for custom measured compression hose left lower extremity with moderate compression 20-30 mg of mercury -Return to clinic as needed   Felecia ShellingBrent M. Britt Theard, DPM [facility]  Dr. Felecia ShellingBrent M. Meghan Warshawsky, DPM    [address]                                        [place], [place] [address]                Office [phone]  Fax [phone]

## 2017-08-21 ENCOUNTER — Ambulatory Visit: Payer: PRIVATE HEALTH INSURANCE

## 2017-08-26 ENCOUNTER — Ambulatory Visit (INDEPENDENT_AMBULATORY_CARE_PROVIDER_SITE_OTHER): Payer: PRIVATE HEALTH INSURANCE

## 2017-08-26 DIAGNOSIS — E538 Deficiency of other specified B group vitamins: Secondary | ICD-10-CM

## 2017-08-26 MED ORDER — CYANOCOBALAMIN 1000 MCG/ML IJ SOLN
1000.0000 ug | Freq: Once | INTRAMUSCULAR | Status: AC
Start: 2017-08-26 — End: 2017-08-26
  Administered 2017-08-26: 1000 ug via INTRAMUSCULAR

## 2017-09-30 ENCOUNTER — Ambulatory Visit: Payer: PRIVATE HEALTH INSURANCE

## 2017-10-02 ENCOUNTER — Encounter: Payer: Self-pay | Admitting: Internal Medicine

## 2017-10-11 ENCOUNTER — Encounter: Payer: Self-pay | Admitting: Internal Medicine

## 2017-10-11 DIAGNOSIS — Z0000 Encounter for general adult medical examination without abnormal findings: Secondary | ICD-10-CM

## 2017-10-13 MED ORDER — NORGESTIMATE-ETH ESTRADIOL 0.25-35 MG-MCG PO TABS: 1.0000 | ORAL_TABLET | Freq: Every day | ORAL | 1 refills | Status: DC

## 2017-11-19 ENCOUNTER — Encounter: Payer: Self-pay | Admitting: Radiology

## 2018-01-26 ENCOUNTER — Ambulatory Visit (INDEPENDENT_AMBULATORY_CARE_PROVIDER_SITE_OTHER): Payer: BLUE CROSS/BLUE SHIELD | Admitting: Internal Medicine

## 2018-01-26 ENCOUNTER — Encounter: Payer: Self-pay | Admitting: Internal Medicine

## 2018-01-26 VITALS — BP 128/82 | HR 89 | Temp 98.3°F | Ht 64.75 in | Wt 218.0 lb

## 2018-01-26 DIAGNOSIS — F32A Depression, unspecified: Secondary | ICD-10-CM

## 2018-01-26 DIAGNOSIS — Z0000 Encounter for general adult medical examination without abnormal findings: Secondary | ICD-10-CM | POA: Diagnosis not present

## 2018-01-26 DIAGNOSIS — R635 Abnormal weight gain: Secondary | ICD-10-CM

## 2018-01-26 DIAGNOSIS — F419 Anxiety disorder, unspecified: Secondary | ICD-10-CM

## 2018-01-26 DIAGNOSIS — E7800 Pure hypercholesterolemia, unspecified: Secondary | ICD-10-CM | POA: Diagnosis not present

## 2018-01-26 DIAGNOSIS — F329 Major depressive disorder, single episode, unspecified: Secondary | ICD-10-CM | POA: Diagnosis not present

## 2018-01-26 LAB — COMPREHENSIVE METABOLIC PANEL
ALBUMIN: 4.6 g/dL (ref 3.5–5.2)
ALT: 49 U/L — ABNORMAL HIGH (ref 0–35)
AST: 31 U/L (ref 0–37)
Alkaline Phosphatase: 57 U/L (ref 39–117)
BUN: 13 mg/dL (ref 6–23)
CHLORIDE: 103 meq/L (ref 96–112)
CO2: 27 mEq/L (ref 19–32)
CREATININE: 0.69 mg/dL (ref 0.40–1.20)
Calcium: 9.8 mg/dL (ref 8.4–10.5)
GFR: 112.74 mL/min (ref >60.00)
Glucose, Bld: 84 mg/dL (ref 70–99)
Potassium: 4.3 mEq/L (ref 3.5–5.1)
SODIUM: 139 meq/L (ref 135–145)
TOTAL PROTEIN: 7.4 g/dL (ref 6.0–8.3)
Total Bilirubin: 0.4 mg/dL (ref 0.2–1.2)

## 2018-01-26 LAB — CBC
HCT: 41.6 % (ref 36.0–46.0)
Hemoglobin: 14.3 g/dL (ref 12.0–15.0)
MCHC: 34.4 g/dL (ref 30.0–36.0)
MCV: 87.5 fl (ref 78.0–100.0)
Platelets: 319 10*3/uL (ref 150.0–400.0)
RBC: 4.75 Mil/uL (ref 3.87–5.11)
RDW: 12.8 % (ref 11.5–15.5)
WBC: 8.3 10*3/uL (ref 4.0–10.5)

## 2018-01-26 LAB — LIPID PANEL
CHOL/HDL RATIO: 7
CHOLESTEROL: 265 mg/dL — AB (ref 0–200)
HDL: 40.4 mg/dL (ref >39.00)
NonHDL: 225
Triglycerides: 310 mg/dL — ABNORMAL HIGH (ref 0.0–149.0)
VLDL: 62 mg/dL — ABNORMAL HIGH (ref 0.0–40.0)

## 2018-01-26 LAB — LDL CHOLESTEROL, DIRECT: LDL DIRECT: 177 mg/dL

## 2018-01-26 MED ORDER — NORGESTIMATE-ETH ESTRADIOL 0.25-35 MG-MCG PO TABS: 1.0000 | ORAL_TABLET | Freq: Every day | ORAL | 3 refills | Status: DC

## 2018-01-26 MED ORDER — PHENTERMINE HCL 15 MG PO CAPS: 15.0000 mg | ORAL_CAPSULE | ORAL | 0 refills | Status: DC

## 2018-01-26 NOTE — Progress Notes (Signed)
Subjective:    Patient ID: Carmen Reid, female    DOB: 1996-01-29, 22 y.o.   MRN: [id number]  HPI  Pt presents to the clinic today for her annual exam. She is also due to follow up chronic conditions.  Anxiety and Depression: Chronic but stable off meds. She denies SI/HI.  HLD: Her last LDL was 134, 12/2016. She is not taking Simvastatin. She does not consume a low fat diet.   Flu: never Tetanus: 3 months ago Pap Smear: 01/2017 Dentist: biannually   Diet: She does eat meat. She consumes fruits and veggies daily. She does eat fried foods. She drinks mostly juice and water Exercise: Walks daily.  Review of Systems      Past Medical History:  Diagnosis Date  . Anxiety   . Depression   . High cholesterol    Not on medication  . Ovarian cyst   . PCOS (polycystic ovarian syndrome)   . 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 acetaZOLAMIDE (DIAMOX) 250 MG tablet Take by mouth.    Marland Kitchen atorvastatin (LIPITOR) 10 MG tablet Take 1 tablet (10 mg total) by mouth daily. 30 tablet 2  . FLUoxetine (PROZAC) 40 MG capsule Take by mouth.    . metFORMIN (GLUCOPHAGE) 500 MG tablet 500 mg daily with breakfast.    . norgestimate-ethinyl estradiol (SPRINTEC 28) 0.25-35 MG-MCG tablet Take 1 tablet by mouth daily. 3 Package 1  . omega-3 acid ethyl esters (LOVAZA) 1 g capsule Take by mouth.    . Omega-3 Fatty Acids (FISH OIL) 1000 MG CAPS Take 1 capsule by mouth 3 (three) times daily.    Marland Kitchen spironolactone (ALDACTONE) 25 MG tablet Take 1 tablet (25 mg total) by mouth daily. 30 tablet 2  . topiramate (TOPAMAX) 50 MG tablet TAKE THREE TABLETS BY MOUTH EVERY NIGHT AT BEDTIME     No current facility-administered medications for this visit.     Allergies  Allergen Reactions  . Latex Swelling  . Warfarin And Related Dermatitis    Family History  Problem Relation Age of Onset    . Cancer Maternal Grandmother 34       breast cancer/skin cancer  . Heart disease Maternal Grandmother   . Diabetes Maternal Grandmother   . Kidney disease Maternal Grandmother   . Hyperlipidemia Maternal Grandmother   . Hypertension Maternal Grandmother   . Arthritis Maternal Grandmother   . Mental illness Maternal Grandmother   . Heart disease Maternal Grandfather   . Cancer Maternal Grandfather 79       prostate cancer  . Diabetes Maternal Grandfather   . Hyperlipidemia Maternal Grandfather   . Mental illness Maternal Grandfather   . Cancer Maternal Uncle        liver cancer  . Kidney disease Maternal Uncle   . Mental illness Paternal Grandmother   . Hypertension Paternal Grandmother   . Hypertension Mother   . Mental illness Mother   . Arthritis Father   . Hyperlipidemia Father   . Heart disease Father   . Stroke Maternal Aunt   . Heart disease Paternal Uncle   . Cancer Maternal Uncle        Colon    Social History   Socioeconomic History  . Marital status: Single    Spouse name: Not on file  . Number of children: 0  . Years of education: HS  .  Highest education level: Not on file  Occupational History  . Occupation: Designer, [employer]  Social Needs  . Financial resource strain: Not on file  . Food insecurity:    Worry: Not on file    Inability: Not on file  . Transportation needs:    Medical: Not on file    Non-medical: Not on file  Tobacco Use  . Smoking status: Never Smoker  . Smokeless tobacco: Never Used  Substance and Sexual Activity  . Alcohol use: Yes    Alcohol/week: 0.0 oz    Comment: occasional  . Drug use: No  . Sexual activity: Yes    Birth control/protection: Pill  Lifestyle  . Physical activity:    Days per week: Not on file    Minutes per session: Not on file  . Stress: Not on file  Relationships  . Social connections:    Talks on phone: Not on file    Gets together: Not on file    Attends religious service: Not on file    Active  member of club or organization: Not on file    Attends meetings of clubs or organizations: Not on file    Relationship status: Not on file  . Intimate partner violence:    Fear of current or ex partner: Not on file    Emotionally abused: Not on file    Physically abused: Not on file    Forced sexual activity: Not on file  Other Topics Concern  . Not on file  Social History Narrative   Occasional caffeine use      Constitutional: Pt reports weight gain. Denies fever, malaise, fatigue, headache.  HEENT: Denies eye pain, eye redness, ear pain, ringing in the ears, wax buildup, runny nose, nasal congestion, bloody nose, or sore throat. Respiratory: Denies difficulty breathing, shortness of breath, cough or sputum production.   Cardiovascular: Denies chest pain, chest tightness, palpitations or swelling in the hands or feet.  Gastrointestinal: Denies abdominal pain, bloating, constipation, diarrhea or blood in the stool.  GU: Denies urgency, frequency, pain with urination, burning sensation, blood in urine, odor or discharge. Musculoskeletal: Denies decrease in range of motion, difficulty with gait, muscle pain or joint pain and swelling.  Skin: Denies redness, rashes, lesions or ulcercations.  Neurological: Denies dizziness, difficulty with memory, difficulty with speech or problems with balance and coordination.  Psych: Denies anxiety, depression, SI/HI.  No other specific complaints in a complete review of systems (except as listed in HPI above).  Objective:   Physical Exam   BP 128/82   Pulse 89   Temp 98.3 F (36.8 C) (Oral)   Ht 5' 4.75" (1.645 m)   Wt 218 lb (98.9 kg)   LMP 01/17/2018   SpO2 99%   BMI 36.56 kg/m  Wt Readings from Last 3 Encounters:  01/26/18 218 lb (98.9 kg)  07/15/17 213 lb (96.6 kg)  02/13/17 217 lb 12.8 oz (98.8 kg)    General: Appears her stated age, obese in NAD. Skin: Warm, dry and intact.  HEENT: Head: normal shape and size; Eyes: sclera  white, no icterus, conjunctiva pink, PERRLA and EOMs intact; Ears: Tm's gray and intact, normal light reflex; Throat/Mouth: Teeth present, mucosa pink and moist, no exudate, lesions or ulcerations noted.  Neck:  Neck supple, trachea midline. No masses, lumps or thyromegaly present.  Cardiovascular: Normal rate and rhythm. S1,S2 noted.  No murmur, rubs or gallops noted. No JVD or BLE edema.  Pulmonary/Chest: Normal effort and positive vesicular breath  sounds. No respiratory distress. No wheezes, rales or ronchi noted.  Abdomen: Soft and nontender. Normal bowel sounds. No distention or masses noted. Liver, spleen and kidneys non palpable. Musculoskeletal: Strength 5/5 BUE/BLE. No difficulty with gait.  Neurological: Alert and oriented. Cranial nerves II-XII grossly intact. Coordination normal.  Psychiatric: Mood and affect normal. Behavior is normal. Judgment and thought content normal.    BMET    Component Value Date/Time   NA 139 01/26/2018 1225   NA 133 06/21/2013 1436   K 4.3 01/26/2018 1225   K 3.3 06/21/2013 1436   CL 103 01/26/2018 1225   CL 100 06/21/2013 1436   CO2 27 01/26/2018 1225   CO2 24 06/21/2013 1436   GLUCOSE 84 01/26/2018 1225   GLUCOSE 108 (H) 06/21/2013 1436   BUN 13 01/26/2018 1225   BUN 5 (L) 06/21/2013 1436   CREATININE 0.69 01/26/2018 1225   CREATININE 0.76 06/21/2013 1436   CALCIUM 9.8 01/26/2018 1225   CALCIUM 9.8 06/21/2013 1436    Lipid Panel     Component Value Date/Time   CHOL 265 (H) 01/26/2018 1225   TRIG 310.0 (H) 01/26/2018 1225   HDL 40.40 01/26/2018 1225   CHOLHDL 7 01/26/2018 1225   VLDL 62.0 (H) 01/26/2018 1225    CBC    Component Value Date/Time   WBC 8.3 01/26/2018 1225   RBC 4.75 01/26/2018 1225   HGB 14.3 01/26/2018 1225   HGB 15.1 06/21/2013 1436   HCT 41.6 01/26/2018 1225   HCT 42.6 06/21/2013 1436   PLT 319.0 01/26/2018 1225   PLT 275 06/21/2013 1436   MCV 87.5 01/26/2018 1225   MCV 87 06/21/2013 1436   MCH 30.0  05/18/2015 0853   MCHC 34.4 01/26/2018 1225   RDW 12.8 01/26/2018 1225   RDW 12.8 06/21/2013 1436    Hgb A1C Lab Results  Component Value Date   HGBA1C 5.3 01/02/2017           Assessment & Plan:   Preventative Health Maintenance:  Encouraged him to get a flu shot in the fall Tetanus UTD Pap smear UTD Encouraged her to consume a balanced diet and exercise regimen  Advised her to see an eye doctor and dentist annually Will check CBC, CMET and Lipid profile today  Abnormal Weight Gain:  Encouraged high protein, low carb diet and exercise for weight loss eRx for Phentermine 15 mg daily Encouraged 150 minutes of exercise weekly  RTC in 1 month, for weight check/med refill

## 2018-01-26 NOTE — Patient Instructions (Signed)

## 2018-01-29 ENCOUNTER — Encounter: Payer: Self-pay | Admitting: Internal Medicine

## 2018-01-29 MED ORDER — ATORVASTATIN CALCIUM 10 MG PO TABS: 10.0000 mg | ORAL_TABLET | Freq: Every day | ORAL | 2 refills | Status: DC

## 2018-01-30 ENCOUNTER — Encounter: Payer: Self-pay | Admitting: Internal Medicine

## 2018-01-30 NOTE — Assessment & Plan Note (Signed)
Controlled off meds  Will monitor 

## 2018-01-30 NOTE — Assessment & Plan Note (Signed)
CMET and lipid profile today Encouraged her to consume a low fat diet May need to restart Simvastatin based on labs

## 2018-02-26 ENCOUNTER — Ambulatory Visit: Payer: BLUE CROSS/BLUE SHIELD | Admitting: Internal Medicine

## 2018-05-04 ENCOUNTER — Encounter: Payer: Self-pay | Admitting: Internal Medicine

## 2018-05-06 ENCOUNTER — Encounter: Payer: Self-pay | Admitting: Internal Medicine

## 2018-05-06 ENCOUNTER — Ambulatory Visit (INDEPENDENT_AMBULATORY_CARE_PROVIDER_SITE_OTHER): Payer: BLUE CROSS/BLUE SHIELD | Admitting: Internal Medicine

## 2018-05-06 VITALS — BP 124/82 | HR 102 | Temp 98.2°F | Wt 226.0 lb

## 2018-05-06 DIAGNOSIS — M79672 Pain in left foot: Secondary | ICD-10-CM

## 2018-05-06 DIAGNOSIS — R600 Localized edema: Secondary | ICD-10-CM

## 2018-05-06 NOTE — Progress Notes (Signed)
Subjective:    Patient ID: Carmen Reid, female    DOB: August 05, 1996, 22 y.o.   MRN: [id number]  HPI  Pt presents to the clinic today with c/o left foot pain. She reports this started 3-4 days ago. She describes the pain as sharp and stabbing. The pain radiates up her calf. The pain is worse with weight bearing. She reports intermittent numbness and tingling of left foot but denies weakness. She denies any injury to the area. She has tried Ibuprofen and ice with some relief.  She also reports persistent swelling of left foot.  This has been an ongoing issue for many years. The swelling in achy and bothersome. It only affects her left foot. She has seen vascular in the past but doesn't recall much from those visits. She recalls have an ultrasound of her leg but reports they thought she had a DVT at that time. She would like a second opinion today. If vascular can not find anything, she would like referral to a lymphedema clinic.  Review of Systems      Past Medical History:  Diagnosis Date  . Anxiety   . Depression   . High cholesterol    Not on medication  . Ovarian cyst   . PCOS (polycystic ovarian syndrome)   . 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 atorvastatin (LIPITOR) 10 MG tablet Take 1 tablet (10 mg total) by mouth daily. 30 tablet 2  . norgestimate-ethinyl estradiol (SPRINTEC 28) 0.25-35 MG-MCG tablet Take 1 tablet by mouth daily. 3 Package 3  . phentermine 15 MG capsule Take 1 capsule (15 mg total) by mouth every morning. 30 capsule 0  . spironolactone (ALDACTONE) 25 MG tablet Take 1 tablet (25 mg total) by mouth daily. 30 tablet 2   No current facility-administered medications for this visit.     Allergies  Allergen Reactions  . Latex Swelling  . Warfarin And Related Dermatitis    Family History  Problem Relation Age of Onset  . Cancer  Maternal Grandmother [id number]       breast cancer/skin cancer  . Heart disease Maternal Grandmother   . Diabetes Maternal Grandmother   . Kidney disease Maternal Grandmother   . Hyperlipidemia Maternal Grandmother   . Hypertension Maternal Grandmother   . Arthritis Maternal Grandmother   . Mental illness Maternal Grandmother   . Heart disease Maternal Grandfather   . Cancer Maternal Grandfather 371       prostate cancer  . Diabetes Maternal Grandfather   . Hyperlipidemia Maternal Grandfather   . Mental illness Maternal Grandfather   . Cancer Maternal Uncle        liver cancer  . Kidney disease Maternal Uncle   . Mental illness Paternal Grandmother   . Hypertension Paternal Grandmother   . Hypertension Mother   . Mental illness Mother   . Arthritis Father   . Hyperlipidemia Father   . Heart disease Father   . Stroke Maternal Aunt   . Heart disease Paternal Uncle   . Cancer Maternal Uncle        Colon    Social History   Socioeconomic History  . Marital status: Single    Spouse name: Not on file  . Number of children: 0  . Years of education: HS  . Highest education level: Not on file  Occupational History  . Occupation:  [employer]  Social Needs  . Financial resource strain: Not on file  . Food insecurity:    Worry: Not on file    Inability: Not on file  . Transportation needs:    Medical: Not on file    Non-medical: Not on file  Tobacco Use  . Smoking status: Never Smoker  . Smokeless tobacco: Never Used  Substance and Sexual Activity  . Alcohol use: Yes    Alcohol/week: 0.0 standard drinks    Comment: occasional  . Drug use: No  . Sexual activity: Yes    Birth control/protection: Pill  Lifestyle  . Physical activity:    Days per week: Not on file    Minutes per session: Not on file  . Stress: Not on file  Relationships  . Social connections:    Talks on phone: Not on file    Gets together: Not on file    Attends religious service: Not on file    Active  member of club or organization: Not on file    Attends meetings of clubs or organizations: Not on file    Relationship status: Not on file  . Intimate partner violence:    Fear of current or ex partner: Not on file    Emotionally abused: Not on file    Physically abused: Not on file    Forced sexual activity: Not on file  Other Topics Concern  . Not on file  Social History Narrative   Occasional caffeine use      Constitutional: Denies fever, malaise, fatigue, headache or abrupt weight changes.  Musculoskeletal: Pt reports left foot pain and swelling. Denies decrease in range of motion, difficulty with gait, muscle pain. Skin: Denies redness, rashes, lesions or ulcercations.  Neurological: Pt reports numbness and tingling in left foot. Denies dizziness, difficulty with memory, difficulty with speech or problems with balance and coordination.    No other specific complaints in a complete review of systems (except as listed in HPI above).  Objective:   Physical Exam   BP 124/82   Pulse (!) 102   Temp 98.2 F (36.8 C) (Oral)   Wt 226 lb (102.5 kg)   LMP 04/26/2018   SpO2 98%   BMI 37.90 kg/m  Wt Readings from Last 3 Encounters:  05/06/18 226 lb (102.5 kg)  01/26/18 218 lb (98.9 kg)  07/15/17 213 lb (96.6 kg)    General: Appears her stated age, obese, in NAD. Skin: Warm, dry and intact. No redness, warmth or bruising noted. Cardiovascular: Normal rate and rhythm. S1,S2 noted.  No murmur, rubs or gallops noted. No JVD or BLE edema. Pedal pulse 2+ bilaterally. Pulmonary/Chest: Normal effort and positive vesicular breath sounds. No respiratory distress. No wheezes, rales or ronchi noted.  Musculoskeletal: Normal flexion, extension and rotation of the left ankle. No joint swelling noted. She has trace edema on the top of her foot, toes. Pain with palpation along the lateral edge of the left heel. Strength 5/5 BLE. No difficulty with gait. Neurological: Alert and oriented.  Sensation intact to BLE.   BMET    Component Value Date/Time   NA 139 01/26/2018 1225   NA 133 06/21/2013 1436   K 4.3 01/26/2018 1225   K 3.3 06/21/2013 1436   CL 103 01/26/2018 1225   CL 100 06/21/2013 1436   CO2 27 01/26/2018 1225   CO2 24 06/21/2013 1436   GLUCOSE 84 01/26/2018 1225   GLUCOSE 108 (H) 06/21/2013 1436   BUN 13 01/26/2018  1225   BUN 5 (L) 06/21/2013 1436   CREATININE 0.69 01/26/2018 1225   CREATININE 0.76 06/21/2013 1436   CALCIUM 9.8 01/26/2018 1225   CALCIUM 9.8 06/21/2013 1436    Lipid Panel     Component Value Date/Time   CHOL 265 (H) 01/26/2018 1225   TRIG 310.0 (H) 01/26/2018 1225   HDL 40.40 01/26/2018 1225   CHOLHDL 7 01/26/2018 1225   VLDL 62.0 (H) 01/26/2018 1225    CBC    Component Value Date/Time   WBC 8.3 01/26/2018 1225   RBC 4.75 01/26/2018 1225   HGB 14.3 01/26/2018 1225   HGB 15.1 06/21/2013 1436   HCT 41.6 01/26/2018 1225   HCT 42.6 06/21/2013 1436   PLT 319.0 01/26/2018 1225   PLT 275 06/21/2013 1436   MCV 87.5 01/26/2018 1225   MCV 87 06/21/2013 1436   MCH 30.0 05/18/2015 0853   MCHC 34.4 01/26/2018 1225   RDW 12.8 01/26/2018 1225   RDW 12.8 06/21/2013 1436    Hgb A1C Lab Results  Component Value Date   HGBA1C 5.3 01/02/2017           Assessment & Plan:   Left Foot Pain:  Exam benign Previous xray reviewed Seems to be improving Continue ice, Ibuprofen and elevation  Swelling, Left Foot:  Referral placed to vascular for second opinion  Return precautions discussed Nicki Reaper, NP

## 2018-05-06 NOTE — Patient Instructions (Signed)
Foot Pain Many things can cause foot pain. Some common causes are:  An injury.  A sprain.  Arthritis.  Blisters.  Bunions.  Follow these instructions at home: Pay attention to any changes in your symptoms. Take these actions to help with your discomfort:  If directed, put ice on the affected area: ? Put ice in a plastic bag. ? Place a towel between your skin and the bag. ? Leave the ice on for 15-20 minutes, 3?4 times a day for 2 days.  Take over-the-counter and prescription medicines only as told by your health care provider.  Wear comfortable, supportive shoes that fit you well. Do not wear high heels.  Do not stand or walk for long periods of time.  Do not lift a lot of weight. This can put added pressure on your feet.  Do stretches to relieve foot pain and stiffness as told by your health care provider.  Rub your foot gently.  Keep your feet clean and dry.  Contact a health care provider if:  Your pain does not get better after a few days of self-care.  Your pain gets worse.  You cannot stand on your foot. Get help right away if:  Your foot is numb or tingling.  Your foot or toes are swollen.  Your foot or toes turn white or blue.  You have warmth and redness along your foot. This information is not intended to replace advice given to you by your health care provider. Make sure you discuss any questions you have with your health care provider. Document Released: 09/01/2015 Document Revised: 01/11/2016 Document Reviewed: 08/31/2014 Elsevier Interactive Patient Education  2018 Elsevier Inc[employer]  

## 2018-05-27 ENCOUNTER — Ambulatory Visit (INDEPENDENT_AMBULATORY_CARE_PROVIDER_SITE_OTHER): Payer: BLUE CROSS/BLUE SHIELD | Admitting: Vascular Surgery

## 2018-05-27 ENCOUNTER — Encounter (INDEPENDENT_AMBULATORY_CARE_PROVIDER_SITE_OTHER): Payer: Self-pay | Admitting: Vascular Surgery

## 2018-05-27 VITALS — BP 153/101 | HR 85 | Resp 16 | Ht 64.0 in | Wt 223.0 lb

## 2018-05-27 DIAGNOSIS — E785 Hyperlipidemia, unspecified: Secondary | ICD-10-CM

## 2018-05-27 DIAGNOSIS — R600 Localized edema: Secondary | ICD-10-CM

## 2018-05-27 NOTE — Progress Notes (Signed)
Subjective:    Patient ID: Carmen Reid, female    DOB: 11-Nov-1995, 22 y.o.   MRN: [id number] Chief Complaint  Patient presents with  . New Patient (Initial Visit)    Edema   Presents as a new patient referred by NP [facility] for evaluation of bilateral lower extremity edema and discomfort.  The patient notes a long-standing history of "swelling" to the left lower extremity.  She notes the swelling occurs on an intermittent basis.  The patient associates her swelling with discomfort.  The discomfort is described as an aching and sometimes pain radiating up the back of her left calf.  The patient notes that she has tried compression socks in the past however feels that she continues to experience edema with their use.  The patient states that she has been to "numerous doctors" and they have not "figured out a cause".  Patient was ruled out for DVT in 09/2016.  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  The patient denies any claudication-like symptoms, rest pain or ulcer formation to the bilateral lower extremity.  The patient denies any DVT history.  Patient states that her mother has "lymphedema".  The patient denies any recent or recurrent bouts of cellulitis.  The patient feels that her symptoms have progressed to the point that she is unable to function on a daily basis and they have become lifestyle limiting.  The patient denies fever, nausea vomiting.  Review of Systems  Constitutional: Negative.   HENT: Negative.   Eyes: Negative.   Respiratory: Negative.   Cardiovascular: Positive for leg swelling.       Lower Extremity Pain  Gastrointestinal: Negative.   Endocrine: Negative.   Genitourinary: Negative.   Musculoskeletal: Negative.   Skin: Negative.   Allergic/Immunologic: Negative.   Neurological: Negative.   Hematological: Negative.   Psychiatric/Behavioral: Negative.       Objective:   Physical Exam    Constitutional: She is oriented to person, place, and time. She appears well-developed and well-nourished. No distress.  HENT:  Head: Normocephalic and atraumatic.  Right Ear: External ear normal.  Left Ear: External ear normal.  Eyes: Pupils are equal, round, and reactive to light. Conjunctivae and EOM are normal.  Neck: Normal range of motion.  Cardiovascular: Normal rate, regular rhythm and normal heart sounds.  Pulmonary/Chest: Effort normal and breath sounds normal.  Musculoskeletal: Normal range of motion. She exhibits edema (Mild edema noted bilaterally).  Neurological: She is alert and oriented to person, place, and time.  Skin: Skin is warm and dry. She is not diaphoretic.  There is no cellulitis, dermatitis, fibrosis or active ulcerations noted at this time.  Psychiatric: She has a normal mood and affect. Her behavior is normal. Judgment and thought content normal.  Vitals reviewed.  BP (!) 153/101 (BP Location: Right Arm, Patient Position: Sitting)   Pulse 85   Resp 16   Ht 5\' 4"  (1.626 m)   Wt 223 lb (101.2 kg)   BMI 38.28 kg/m   Past Medical History:  Diagnosis Date  . Anxiety   . Depression   . High cholesterol    Not on medication  . Ovarian cyst   . PCOS (polycystic ovarian syndrome)   . 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 Marital status: Single    Spouse  name: Not on file  . Number of children: 0  . Years of education: HS  . Highest education level: Not on file  Occupational History  . Occupation: Designer, jewellery  Social Needs  . Financial resource strain: Not on file  . Food insecurity:    Worry: Not on file    Inability: Not on file  . Transportation needs:    Medical: Not on file    Non-medical: Not on file  Tobacco Use  . Smoking status: Never Smoker  . Smokeless tobacco: Never Used  Substance and Sexual Activity  . Alcohol use: Yes    Alcohol/week: 0.0  standard drinks    Comment: occasional  . Drug use: No  . Sexual activity: Yes    Birth control/protection: Pill  Lifestyle  . Physical activity:    Days per week: Not on file    Minutes per session: Not on file  . Stress: Not on file  Relationships  . Social connections:    Talks on phone: Not on file    Gets together: Not on file    Attends religious service: Not on file    Active member of club or organization: Not on file    Attends meetings of clubs or organizations: Not on file    Relationship status: Not on file  . Intimate partner violence:    Fear of current or ex partner: Not on file    Emotionally abused: Not on file    Physically abused: Not on file    Forced sexual activity: Not on file  Other Topics Concern  . Not on file  Social History Narrative   Occasional caffeine use    Past Surgical History:  Procedure Laterality Date  . TONSILLECTOMY     age 42 or 36   Family History  Problem Relation Age of Onset  . Cancer Maternal Grandmother 3       breast cancer/skin cancer  . Heart disease Maternal Grandmother   . Diabetes Maternal Grandmother   . Kidney disease Maternal Grandmother   . Hyperlipidemia Maternal Grandmother   . Hypertension Maternal Grandmother   . Arthritis Maternal Grandmother   . Mental illness Maternal Grandmother   . Heart disease Maternal Grandfather   . Cancer Maternal Grandfather 89       prostate cancer  . Diabetes Maternal Grandfather   . Hyperlipidemia Maternal Grandfather   . Mental illness Maternal Grandfather   . Cancer Maternal Uncle        liver cancer  . Kidney disease Maternal Uncle   . Mental illness Paternal Grandmother   . Hypertension Paternal Grandmother   . Hypertension Mother   . Mental illness Mother   . Arthritis Father   . Hyperlipidemia Father   . Heart disease Father   . Stroke Maternal Aunt   . Heart disease Paternal Uncle   . Cancer Maternal Uncle        Colon   Allergies  Allergen Reactions  .  Latex Swelling  . Warfarin And Related Dermatitis      Assessment & Plan:  Presents as a new patient referred by NP [facility] for evaluation of bilateral lower extremity edema and discomfort.  The patient notes a long-standing history of "swelling" to the left lower extremity.  She notes the swelling occurs on an intermittent basis.  The patient associates her swelling with discomfort.  The discomfort is described as an aching and sometimes pain radiating up the back of her left calf.  The patient notes that she has tried compression socks in the past however feels that she continues to experience edema with their use.  The patient states that she has been to "numerous doctors" and they have not "figured out a cause".  Patient was ruled out for DVT in 09/2016.  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  The patient denies any claudication-like symptoms, rest pain or ulcer formation to the bilateral lower extremity.  The patient denies any DVT history.  Patient states that her mother has "lymphedema".  The patient denies any recent or recurrent bouts of cellulitis.  The patient feels that her symptoms have progressed to the point that she is unable to function on a daily basis and they have become lifestyle limiting.  The patient denies fever, nausea vomiting.  1. Bilateral lower extremity edema - New The patient was encouraged to wear graduated compression stockings (20-30 mmHg) on a daily basis. The patient was instructed to begin wearing the stockings first thing in the morning and removing them in the evening. The patient was instructed specifically not to sleep in the stockings. Prescription given.  In addition, behavioral modification including elevation during the day will be initiated. Anti-inflammatories for pain. I will bring the patient back at her convenience and undergo a bilateral lower extremity venous duplex to rule out any  contributing venous versus lymphatic disease. The patient will follow up in three months to asses conservative management.  Information on compression stockings was given to the patient. The patient was instructed to call the office in the interim if any worsening edema or ulcerations to the legs, feet or toes occurs. The patient expresses their understanding.  - VAS [place] LOWER EXTREMITY VENOUS REFLUX; Future  2. Hyperlipidemia, unspecified hyperlipidemia type - Stable Encouraged good control as its slows the progression of atherosclerotic disease  Current Outpatient Medications on File Prior to Visit  Medication Sig Dispense Refill  . atorvastatin (LIPITOR) 10 MG tablet Take 1 tablet (10 mg total) by mouth daily. 30 tablet 2  . norgestimate-ethinyl estradiol (SPRINTEC 28) 0.25-35 MG-MCG tablet Take 1 tablet by mouth daily. 3 Package 3  . phentermine 15 MG capsule Take 1 capsule (15 mg total) by mouth every morning. (Patient not taking: Reported on 05/27/2018) 30 capsule 0  . spironolactone (ALDACTONE) 25 MG tablet Take 1 tablet (25 mg total) by mouth daily. (Patient not taking: Reported on 05/27/2018) 30 tablet 2   No current facility-administered medications on file prior to visit.    There are no Patient Instructions on file for this visit. No follow-ups on file.  Yesika Rispoli A Aiana Nordquist, PA-C

## 2018-08-20 ENCOUNTER — Ambulatory Visit (INDEPENDENT_AMBULATORY_CARE_PROVIDER_SITE_OTHER): Payer: BLUE CROSS/BLUE SHIELD

## 2018-08-20 ENCOUNTER — Encounter (INDEPENDENT_AMBULATORY_CARE_PROVIDER_SITE_OTHER): Payer: Self-pay | Admitting: Vascular Surgery

## 2018-08-20 ENCOUNTER — Ambulatory Visit (INDEPENDENT_AMBULATORY_CARE_PROVIDER_SITE_OTHER): Payer: BLUE CROSS/BLUE SHIELD | Admitting: Vascular Surgery

## 2018-08-20 VITALS — BP 139/92 | HR 106 | Resp 16 | Ht 64.0 in | Wt 221.8 lb

## 2018-08-20 DIAGNOSIS — R600 Localized edema: Secondary | ICD-10-CM

## 2018-08-20 DIAGNOSIS — E785 Hyperlipidemia, unspecified: Secondary | ICD-10-CM | POA: Diagnosis not present

## 2018-08-20 NOTE — Progress Notes (Signed)
MRN : [id number]  Salvadore OxfordCierra Elizabeth Timmie FoersterDawn Tumblin is a 23 y.o. (1995-10-17) female who presents with chief complaint of  Chief Complaint  Patient presents with  . Follow-up  .  History of Present Illness:   The patient returns to the office for followup evaluation regarding leg swelling.  The swelling has persisted and the pain associated with swelling continues. There have not been any interval development of a ulcerations or wounds.  Since the previous visit the patient has been wearing graduated compression stockings and has noted little if any improvement in the lymphedema. The patient has been using compression routinely morning until night.  The patient also states elevation during the day and exercise is being done too.   Current Meds  Medication Sig  . atorvastatin (LIPITOR) 10 MG tablet Take 1 tablet (10 mg total) by mouth daily.  . norgestimate-ethinyl estradiol (SPRINTEC 28) 0.25-35 MG-MCG tablet Take 1 tablet by mouth daily.    Past Medical History:  Diagnosis Date  . Anxiety   . Depression   . High cholesterol    Not on medication  . Ovarian cyst   . PCOS (polycystic ovarian syndrome)   . 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 TONSILLECTOMY     age 89 or 89    Social History Social History   Tobacco Use  . Smoking status: Never Smoker  . Smokeless tobacco: Never Used  Substance Use Topics  . Alcohol use: Yes    Alcohol/week: 0.0 standard drinks    Comment: occasional  . Drug use: No    Family History Family History  Problem Relation Age of Onset  . Cancer Maternal Grandmother [id number]       breast cancer/skin cancer  . Heart disease Maternal Grandmother   . Diabetes Maternal Grandmother   . Kidney disease Maternal Grandmother   . Hyperlipidemia Maternal Grandmother   . Hypertension Maternal Grandmother   . Arthritis Maternal Grandmother   . Mental illness  Maternal Grandmother   . Heart disease Maternal Grandfather   . Cancer Maternal Grandfather [id number]       prostate cancer  . Diabetes Maternal Grandfather   . Hyperlipidemia Maternal Grandfather   . Mental illness Maternal Grandfather   . Cancer Maternal Uncle        liver cancer  . Kidney disease Maternal Uncle   . Mental illness Paternal Grandmother   . Hypertension Paternal Grandmother   . Hypertension Mother   . Mental illness Mother   . Arthritis Father   . Hyperlipidemia Father   . Heart disease Father   . Stroke Maternal Aunt   . Heart disease Paternal Uncle   . Cancer Maternal Uncle        Colon    Allergies  Allergen Reactions  . Latex Swelling  . Warfarin And Related Dermatitis     REVIEW OF SYSTEMS (Negative unless checked)  Constitutional: [] Weight loss  [] Fever  [] Chills Cardiac: [] Chest pain   [] Chest pressure   [] Palpitations   [] Shortness of breath when laying flat   [] Shortness of breath with exertion. Vascular:  [] Pain in legs with walking   [] Pain in legs at rest  [] History of DVT   [] Phlebitis   [x] Swelling in legs   [] Varicose veins   [] Non-healing ulcers Pulmonary:   [] Uses home oxygen   [] Productive cough   [] Hemoptysis   [] Wheeze  []   COPD   [] Asthma Neurologic:  [] Dizziness   [] Seizures   [] History of stroke   [] History of TIA  [] Aphasia   [] Vissual changes   [] Weakness or numbness in arm   [] Weakness or numbness in leg Musculoskeletal:   [] Joint swelling   [] Joint pain   [] Low back pain Hematologic:  [] Easy bruising  [] Easy bleeding   [] Hypercoagulable state   [] Anemic Gastrointestinal:  [] Diarrhea   [] Vomiting  [] Gastroesophageal reflux/heartburn   [] Difficulty swallowing. Genitourinary:  [] Chronic kidney disease   [] Difficult urination  [] Frequent urination   [] Blood in urine Skin:  [] Rashes   [] Ulcers  Psychological:  [] History of anxiety   []  History of major depression.  Physical Examination  Vitals:   08/20/18 1340  BP: (!) 139/92  Pulse: (!)  106  Resp: 16  Weight: 221 lb 12.8 oz (100.6 kg)  Height: 5\' 4"  (1.626 m)   Body mass index is 38.07 kg/m. Gen: WD/WN, NAD Head: [place]/AT, No temporalis wasting.  Ear/Nose/Throat: Hearing grossly intact, nares w/o erythema or drainage Eyes: PER, EOMI, sclera nonicteric.  Neck: Supple, no large masses.   Pulmonary:  Good air movement, no audible wheezing bilaterally, no use of accessory muscles.  Cardiac: RRR, no JVD Vascular: scattered varicosities present bilaterally.  Moderate venous stasis changes to the legs bilaterally.  3-4+ soft pitting edema bilaterally Vessel Right Left  Radial Palpable Palpable  PT Palpable Palpable  DP Palpable Palpable  Gastrointestinal: Non-distended. No guarding/no peritoneal signs.  Musculoskeletal: M/S 5/5 throughout.  No deformity or atrophy.  Neurologic: CN 2-12 intact. Symmetrical.  Speech is fluent. Motor exam as listed above. Psychiatric: Judgment intact, Mood & affect appropriate for pt's clinical situation. Dermatologic: No rashes or ulcers noted.  No changes consistent with cellulitis. Lymph : No lichenification or skin changes of chronic lymphedema.  CBC Lab Results  Component Value Date   WBC 8.3 01/26/2018   HGB 14.3 01/26/2018   HCT 41.6 01/26/2018   MCV 87.5 01/26/2018   PLT 319.0 01/26/2018    BMET    Component Value Date/Time   NA 139 01/26/2018 1225   NA 133 06/21/2013 1436   K 4.3 01/26/2018 1225   K 3.3 06/21/2013 1436   CL 103 01/26/2018 1225   CL 100 06/21/2013 1436   CO2 27 01/26/2018 1225   CO2 24 06/21/2013 1436   GLUCOSE 84 01/26/2018 1225   GLUCOSE 108 (H) 06/21/2013 1436   BUN 13 01/26/2018 1225   BUN 5 (L) 06/21/2013 1436   CREATININE 0.69 01/26/2018 1225   CREATININE 0.76 06/21/2013 1436   CALCIUM 9.8 01/26/2018 1225   CALCIUM 9.8 06/21/2013 1436   CrCl cannot be calculated (Patient's most recent lab result is older than the maximum 21 days allowed.).  COAG Lab Results  Component Value Date   INR  0.95 05/18/2015    Radiology No results found.   Assessment/Plan 1. Bilateral lower extremity edema Recommend:  No surgery or intervention at this point in time.    I have reviewed my previous discussion with the patient regarding swelling and why it causes symptoms.  Patient will continue wearing graduated compression stockings class 1 (20-30 mmHg) on a daily basis. The patient will  beginning wearing the stockings first thing in the morning and removing them in the evening. The patient is instructed specifically not to sleep in the stockings.    In addition, behavioral modification including several periods of elevation of the lower extremities during the day will be continued.  This was  reviewed with the patient during the initial visit.  The patient will also continue routine exercise, especially walking on a daily basis as was discussed during the initial visit.    Despite conservative treatments including graduated compression therapy class 1 and behavioral modification including exercise and elevation the patient  has not obtained adequate control of the lymphedema.  The patient still has stage 3 lymphedema and therefore, I believe that a lymph pump should be added to improve the control of the patient's lymphedema.  Additionally, a lymph pump is warranted because it will reduce the risk of cellulitis and ulceration in the future.  Patient should follow-up in six months    2. Hyperlipidemia, unspecified hyperlipidemia type Continue statin as ordered and reviewed, no changes at this time     Levora Dredge, MD  08/20/2018 5:18 PM

## 2018-10-14 ENCOUNTER — Ambulatory Visit: Payer: Self-pay | Admitting: Internal Medicine

## 2018-11-11 ENCOUNTER — Other Ambulatory Visit: Payer: Self-pay

## 2018-11-11 DIAGNOSIS — E7800 Pure hypercholesterolemia, unspecified: Secondary | ICD-10-CM

## 2018-11-11 DIAGNOSIS — Z0000 Encounter for general adult medical examination without abnormal findings: Secondary | ICD-10-CM

## 2018-11-11 MED ORDER — NORGESTIMATE-ETH ESTRADIOL 0.25-35 MG-MCG PO TABS: 1.0000 | ORAL_TABLET | Freq: Every day | ORAL | 0 refills | Status: DC

## 2019-02-22 ENCOUNTER — Ambulatory Visit (INDEPENDENT_AMBULATORY_CARE_PROVIDER_SITE_OTHER): Payer: Self-pay | Admitting: Vascular Surgery

## 2019-03-11 ENCOUNTER — Encounter (INDEPENDENT_AMBULATORY_CARE_PROVIDER_SITE_OTHER): Payer: Self-pay | Admitting: Vascular Surgery

## 2019-06-03 ENCOUNTER — Other Ambulatory Visit: Payer: Self-pay

## 2019-06-03 DIAGNOSIS — Z0000 Encounter for general adult medical examination without abnormal findings: Secondary | ICD-10-CM

## 2019-06-03 MED ORDER — NORGESTIMATE-ETH ESTRADIOL 0.25-35 MG-MCG PO TABS: 1.0000 | ORAL_TABLET | Freq: Every day | ORAL | 0 refills | Status: DC

## 2019-06-03 NOTE — Addendum Note (Signed)
Addended by: Jearld Fenton on: 06/03/2019 11:07 AM   Modules accepted: Orders

## 2019-06-03 NOTE — Telephone Encounter (Signed)
Pt's mother Ellenie Salome sent msg via her mychart as she states pt does not currently have a cell phone to be reached, requesting refill of birth control, pt does not currently have insurance... will paste note from mom below. Rx last filled   [place], [place] Bruns, Coralie Keens, NP 2 days ago     I know this might be unusual but Dr Garnette Gunner sees my daughter Carmen Reid her bday is 10/01/2018. She is wanting to know if she can get one refill on her birth control? Right now she has no one and can not come in for a visit until the first of the year. She does not have a phone right now that I why I am trying to communicate instead of her.

## 2019-06-03 NOTE — Telephone Encounter (Signed)
done

## 2021-08-19 NOTE — L&D Delivery Note (Addendum)
Delivery Note At 9:06 AM a viable female was delivered via Vaginal, Spontaneous (Presentation: OA ).  APGAR: 9, 9; weight  .   Placenta status: Spontaneous, Intact.  Cord: 3 vessels with the following complications: None.  Cord pH: none  Anesthesia: Epidural Episiotomy:  none Lacerations:  left periurethral abrasion Suture Repair:  not required Est. Blood Loss (mL):  98  Mom to postpartum.  Baby to Couplet care / Skin to Skin.  Celine Mans 04/05/2022, 9:36 AM   I attest that I was gowned and gloved for the entirety of delivery of infant and placenta and I agree with the above documentation. I also performed the Postplacental IUD insertion.    Post-Placental IUD Insertion Procedure Note  Patient identified, informed consent signed prior to delivery, signed copy in chart, time out was performed.    Vaginal, labial and perineal areas thoroughly inspected for lacerations. No  degree laceration identified -prior to insertion of IUD.  Mirena  - IUD grasped between sterile gloved fingers. Sterile lubrication applied to sterile gloved hand for ease of insertion. Fundus identified through abdominal wall using non-insertion hand. IUD inserted to fundus with bimanual technique. IUD carefully released at the fundus and insertion hand gently removed from vagina.    Strings trimmed to the level of the introitus. Patient tolerated procedure well.   Patient given post procedure instructions and IUD care card with expiration date.  Patient is asked to keep IUD strings tucked in her vagina until her postpartum follow up visit in 4-6 weeks. Patient advised to abstain from sexual intercourse and pulling on strings before her follow-up visit. Patient verbalized an understanding of the plan of care and agrees.

## 2021-10-15 ENCOUNTER — Other Ambulatory Visit (HOSPITAL_COMMUNITY)
Admission: RE | Admit: 2021-10-15 | Discharge: 2021-10-15 | Disposition: A | Discharge status: Home / Self Care | Payer: Medicaid Other | Source: Ambulatory Visit | Attending: Family Medicine | Admitting: Family Medicine

## 2021-10-15 ENCOUNTER — Ambulatory Visit (INDEPENDENT_AMBULATORY_CARE_PROVIDER_SITE_OTHER): Payer: Medicaid Other

## 2021-10-15 ENCOUNTER — Encounter: Payer: Self-pay | Admitting: Family Medicine

## 2021-10-15 ENCOUNTER — Ambulatory Visit (INDEPENDENT_AMBULATORY_CARE_PROVIDER_SITE_OTHER): Payer: Self-pay | Admitting: Family Medicine

## 2021-10-15 ENCOUNTER — Other Ambulatory Visit: Payer: Self-pay

## 2021-10-15 VITALS — BP 146/82 | HR 111 | Wt 241.0 lb

## 2021-10-15 DIAGNOSIS — Z3A10 10 weeks gestation of pregnancy: Secondary | ICD-10-CM

## 2021-10-15 DIAGNOSIS — Z3401 Encounter for supervision of normal first pregnancy, first trimester: Secondary | ICD-10-CM

## 2021-10-15 DIAGNOSIS — O0990 Supervision of high risk pregnancy, unspecified, unspecified trimester: Secondary | ICD-10-CM | POA: Diagnosis present

## 2021-10-15 DIAGNOSIS — O161 Unspecified maternal hypertension, first trimester: Secondary | ICD-10-CM

## 2021-10-15 DIAGNOSIS — Z833 Family history of diabetes mellitus: Secondary | ICD-10-CM

## 2021-10-15 DIAGNOSIS — O99211 Obesity complicating pregnancy, first trimester: Secondary | ICD-10-CM

## 2021-10-15 MED ORDER — PREPLUS 27-1 MG PO TABS: 1.0000 | ORAL_TABLET | Freq: Every day | ORAL | 13 refills | Status: DC

## 2021-10-15 MED ORDER — ASPIRIN EC 81 MG PO TBEC: 81.0000 mg | DELAYED_RELEASE_TABLET | Freq: Every day | ORAL | 2 refills | Status: DC

## 2021-10-15 NOTE — Patient Instructions (Addendum)
°  Mychart USERNAME: [id number]

## 2021-10-15 NOTE — Progress Notes (Signed)
INITIAL PRENATAL VISIT  Subjective:   Carmen Reid is being seen today for her first obstetrical visit.  This is not a planned pregnancy. This is a desired pregnancy.  She is at [redacted]w[redacted]d gestation by LMP/US today Her obstetrical history is significant for obesity and PCOS . Relationship with FOB:  involved . Patient does intend to breast feed. Pregnancy history fully reviewed.  Patient reports no complaints.  Indications for ASA therapy (per uptodate) One of the following: Previous pregnancy with preeclampsia, especially early onset and with an adverse outcome No Multifetal gestation No Chronic hypertension No Type 1 or 2 diabetes mellitus No Chronic kidney disease No Autoimmune disease (antiphospholipid syndrome, systemic lupus erythematosus) No  Two or more of the following: Nulliparity Yes Obesity (body mass index >30 kg/m2) Yes Family history of preeclampsia in mother or sister Yes Age ?35 years No Sociodemographic characteristics (African American race, low socioeconomic level) No Personal risk factors (eg, previous pregnancy with low birth weight or small for gestational age infant, previous adverse pregnancy outcome [eg, stillbirth], interval >10 years between pregnancies) No  Indications for early GDM screening  First-degree relative with diabetes Yes BMI >30kg/m2 Yes Age > 25 Yes Previous birth of an infant weighing ?4000 g No Gestational diabetes mellitus in a previous pregnancy No Glycated hemoglobin ?5.7 percent (39 mmol/mol), impaired glucose tolerance, or impaired fasting glucose on previous testing No High-risk race/ethnicity (eg, African American, Latino, Native American, Asian American, Pacific Islander) No Previous stillbirth of unknown cause No Maternal birthweight > 9 lbs No History of cardiovascular disease No Hypertension or on therapy for hypertension No High-density lipoprotein cholesterol level <35 mg/dL (0.90 mmol/L) and/or a triglyceride  level >250 mg/dL (2.82 mmol/L) No Polycystic ovary syndrome Yes Physical inactivity Yes Other clinical condition associated with insulin resistance (eg, severe obesity, acanthosis nigricans) No Current use of glucocorticoids No   Early screening tests: FBS, A1C, Random CBG, glucose challenge   Review of Systems:   Review of Systems  Objective:    Obstetric History OB History  Gravida Para Term Preterm AB Living  1 0 0 0 0 0  SAB IAB Ectopic Multiple Live Births  0 0 0 0      # Outcome Date GA Lbr Len/2nd Weight Sex Delivery Anes PTL Lv  1 Current             Past Medical History:  Diagnosis Date   Anxiety    Depression    High cholesterol    Not on medication   Ovarian cyst    PCOS (polycystic ovarian syndrome)    Vascular disease    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   No current facility-administered medications on file prior to visit.    Allergies  Allergen Reactions   Latex Swelling   Warfarin And Related Dermatitis    Social History:  reports that she has never smoked. She has never used smokeless tobacco. She reports that she does not currently use alcohol. She reports that she does not use drugs.  Family History  Problem Relation Age of Onset   Cancer Maternal Grandmother 41       breast cancer/skin cancer   Heart disease Maternal Grandmother    Diabetes Maternal Grandmother    Kidney disease Maternal  Grandmother    Hyperlipidemia Maternal Grandmother    Hypertension Maternal Grandmother    Arthritis Maternal Grandmother    Mental illness Maternal Grandmother    Heart disease Maternal Grandfather    Cancer Maternal Grandfather 19       prostate cancer   Diabetes Maternal Grandfather    Hyperlipidemia Maternal Grandfather    Mental illness Maternal Grandfather    Cancer  Maternal Uncle        liver cancer   Kidney disease Maternal Uncle    Mental illness Paternal Grandmother    Hypertension Paternal Grandmother    Hypertension Mother    Mental illness Mother    Arthritis Father    Hyperlipidemia Father    Heart disease Father    Stroke Maternal Aunt    Heart disease Paternal Uncle    Cancer Maternal Uncle        Colon    The following portions of the patient's history were reviewed and updated as appropriate: allergies, current medications, past family history, past medical history, past social history, past surgical history and problem list.  Review of Systems Review of Systems  Constitutional:  Negative for chills and fever.  HENT:  Negative for congestion and sore throat.   Eyes:  Negative for pain and visual disturbance.  Respiratory:  Negative for cough, chest tightness and shortness of breath.   Cardiovascular:  Negative for chest pain.  Gastrointestinal:  Negative for abdominal pain, diarrhea, nausea and vomiting.  Endocrine: Negative for cold intolerance and heat intolerance.  Genitourinary:  Negative for dysuria and flank pain.  Musculoskeletal:  Negative for back pain.  Skin:  Negative for rash.  Allergic/Immunologic: Negative for food allergies.  Neurological:  Negative for dizziness and light-headedness.  Psychiatric/Behavioral:  Negative for agitation.      Physical Exam:  BP (!) 146/82    Pulse (!) 111    Wt 241 lb (109.3 kg)    LMP 07/31/2021 (Approximate)    BMI 41.37 kg/m  CONSTITUTIONAL: Well-developed, well-nourished female in no acute distress.  HENT:  Normocephalic, atraumatic, External right and left ear normal. Oropharynx is clear and moist EYES: Conjunctivae normal. No scleral icterus.  NECK: Normal range of motion, supple, no masses.  Normal thyroid.  SKIN: Skin is warm and dry. No rash noted. Not diaphoretic. No erythema. No pallor. MUSCULOSKELETAL: Normal range of motion. No tenderness.  No cyanosis, clubbing, or  edema.   NEUROLOGIC: Alert and oriented to person, place, and time. Normal muscle tone coordination.  PSYCHIATRIC: Normal mood and affect. Normal behavior. Normal judgment and thought content. CARDIOVASCULAR: Normal heart rate noted, regular rhythm RESPIRATORY: Clear to auscultation bilaterally. Effort and breath sounds normal, no problems with respiration noted. BREASTS: deferred ABDOMEN: Soft, normal bowel sounds, no distention noted.  No tenderness, rebound or guarding. Fundal ht: below pelvis PELVIC: deferred- desired to have next visit FHR: [place] today -162   Assessment:    Pregnancy: G1P0000  1. Encounter for supervision of normal first pregnancy in first trimester - US OB Limited; Future - [place] MFM OB DETAIL +14 WK; Future  2. Supervision of high risk pregnancy, antepartum - [place] MFM OB DETAIL +14 WK; Future - Prenatal Vit-Fe Fumarate-FA (PREPLUS) 27-1 MG TABS; Take 1 tablet by mouth daily.  Dispense: 30 tablet; Refill: 13 - aspirin EC 81 MG tablet; Take 1 tablet (81 mg total) by mouth daily. Take after 12 weeks for prevention of preeclampsia later in pregnancy  Dispense: 300 tablet; Refill: 2 - Genetic Screening -  CBC/D/Plt+RPR+Rh+ABO+RubIgG... - Urine Culture - Cervicovaginal ancillary only - Comprehensive metabolic panel - Protein / creatinine ratio, urine - TSH  3. Obesity affecting pregnancy in first trimester - Hemoglobin A1c  4. [redacted] weeks gestation of pregnancy  5. Family history of diabetes mellitus - Hemoglobin A1c  6. Elevated blood pressure - 09/10/2019 138/86 - today is 146/82 - likely chronic hypertension    Plan:     Initial labs drawn. Prenatal vitamins. Problem list reviewed and updated. Reviewed in detail the nature of the practice with collaborative care between  Genetic screening discussed: NIPS/First trimester screen/Quad/AFP requested. Role of ultrasound in pregnancy discussed; Anatomy US: requested. Amniocentesis discussed: not  indicated. Follow up in 4 weeks. Discussed clinic routines, schedule of care and testing, genetic screening options, involvement of students and residents under the direct supervision of APPs and doctors and presence of female providers. Pt verbalized understanding.  Future Appointments  Date Time Provider [place]  11/12/2021  3:30 PM Aletha Halim, MD [facility]  12/11/2021  2:30 PM [facility] NURSE [facility]  12/11/2021  2:45 PM [facility] US4 [facility]  01/17/2022  2:00 PM Baity, Coralie Keens, NP [facility]    Caren Macadam, MD 10/18/2021 11:45 AM

## 2021-10-16 ENCOUNTER — Encounter: Payer: Self-pay | Admitting: Family Medicine

## 2021-10-16 LAB — CBC/D/PLT+RPR+RH+ABO+RUBIGG...
Antibody Screen: NEGATIVE
Basophils Absolute: 0 10*3/uL (ref 0.0–0.2)
Basos: 0 %
EOS (ABSOLUTE): 0.1 10*3/uL (ref 0.0–0.4)
Eos: 1 %
HCV Ab: NONREACTIVE
HIV Screen 4th Generation wRfx: NONREACTIVE
Hematocrit: 40.1 % (ref 34.0–46.6)
Hemoglobin: 13.7 g/dL (ref 11.1–15.9)
Hepatitis B Surface Ag: NEGATIVE
Immature Grans (Abs): 0 10*3/uL (ref 0.0–0.1)
Immature Granulocytes: 0 %
Lymphocytes Absolute: 3.1 10*3/uL (ref 0.7–3.1)
Lymphs: 26 %
MCH: 29.5 pg (ref 26.6–33.0)
MCHC: 34.2 g/dL (ref 31.5–35.7)
MCV: 86 fL (ref 79–97)
Monocytes Absolute: 0.7 10*3/uL (ref 0.1–0.9)
Monocytes: 6 %
Neutrophils Absolute: 8.1 10*3/uL — ABNORMAL HIGH (ref 1.4–7.0)
Neutrophils: 67 %
Platelets: 331 10*3/uL (ref 150–450)
RBC: 4.65 x10E6/uL (ref 3.77–5.28)
RDW: 12.7 % (ref 11.7–15.4)
RPR Ser Ql: NONREACTIVE
Rh Factor: POSITIVE
Rubella Antibodies, IGG: 1.34 index (ref >0.99)
WBC: 12 10*3/uL — ABNORMAL HIGH (ref 3.4–10.8)

## 2021-10-16 LAB — COMPREHENSIVE METABOLIC PANEL
ALT: 26 IU/L (ref 0–32)
AST: 18 IU/L (ref 0–40)
Albumin/Globulin Ratio: 1.9 (ref 1.2–2.2)
Albumin: 4.4 g/dL (ref 3.9–5.0)
Alkaline Phosphatase: 62 IU/L (ref 44–121)
BUN/Creatinine Ratio: 11 (ref 9–23)
BUN: 7 mg/dL (ref 6–20)
Bilirubin Total: <0.2 mg/dL (ref 0.0–1.2)
CO2: 19 mmol/L — ABNORMAL LOW (ref 20–29)
Calcium: 9.3 mg/dL (ref 8.7–10.2)
Chloride: 104 mmol/L (ref 96–106)
Creatinine, Ser: 0.61 mg/dL (ref 0.57–1.00)
Globulin, Total: 2.3 g/dL (ref 1.5–4.5)
Glucose: 85 mg/dL (ref 70–99)
Potassium: 4 mmol/L (ref 3.5–5.2)
Sodium: 138 mmol/L (ref 134–144)
Total Protein: 6.7 g/dL (ref 6.0–8.5)
eGFR: 126 mL/min/{1.73_m2} (ref >59)

## 2021-10-16 LAB — HCV INTERPRETATION

## 2021-10-16 LAB — HEMOGLOBIN A1C
Est. average glucose Bld gHb Est-mCnc: 105 mg/dL
Hgb A1c MFr Bld: 5.3 % (ref 4.8–5.6)

## 2021-10-16 LAB — PROTEIN / CREATININE RATIO, URINE
Creatinine, Urine: 314.2 mg/dL
Protein, Ur: 16.6 mg/dL
Protein/Creat Ratio: 53 mg/g creat (ref 0–200)

## 2021-10-16 LAB — TSH: TSH: 3.11 u[IU]/mL (ref 0.450–4.500)

## 2021-10-17 LAB — CERVICOVAGINAL ANCILLARY ONLY
Chlamydia: NEGATIVE
Comment: NEGATIVE
Comment: NORMAL
Neisseria Gonorrhea: NEGATIVE

## 2021-10-17 LAB — URINE CULTURE

## 2021-10-23 ENCOUNTER — Encounter: Payer: Self-pay | Admitting: *Deleted

## 2021-10-23 ENCOUNTER — Encounter: Payer: Self-pay | Admitting: Radiology

## 2021-10-23 ENCOUNTER — Telehealth: Payer: Self-pay | Admitting: *Deleted

## 2021-10-23 ENCOUNTER — Telehealth: Payer: Self-pay | Admitting: Radiology

## 2021-10-23 NOTE — Telephone Encounter (Signed)
Patient was informed of Panorama NIPS results 

## 2021-10-23 NOTE — Telephone Encounter (Signed)
Left message for pt to call back for panorama results  ?

## 2021-11-01 ENCOUNTER — Encounter: Payer: Self-pay | Admitting: *Deleted

## 2021-11-12 ENCOUNTER — Encounter: Payer: Self-pay | Admitting: Obstetrics and Gynecology

## 2021-11-29 ENCOUNTER — Ambulatory Visit (INDEPENDENT_AMBULATORY_CARE_PROVIDER_SITE_OTHER): Payer: Medicaid Other | Admitting: Obstetrics and Gynecology

## 2021-11-29 ENCOUNTER — Encounter: Payer: Self-pay | Admitting: Obstetrics and Gynecology

## 2021-11-29 ENCOUNTER — Other Ambulatory Visit (HOSPITAL_COMMUNITY)
Admission: RE | Admit: 2021-11-29 | Discharge: 2021-11-29 | Disposition: A | Discharge status: Home / Self Care | Payer: Medicaid Other | Source: Ambulatory Visit | Attending: Obstetrics and Gynecology | Admitting: Obstetrics and Gynecology

## 2021-11-29 VITALS — BP 153/88 | HR 116 | Wt 235.0 lb

## 2021-11-29 DIAGNOSIS — Z124 Encounter for screening for malignant neoplasm of cervix: Secondary | ICD-10-CM

## 2021-11-29 DIAGNOSIS — O99212 Obesity complicating pregnancy, second trimester: Secondary | ICD-10-CM

## 2021-11-29 DIAGNOSIS — O99211 Obesity complicating pregnancy, first trimester: Secondary | ICD-10-CM

## 2021-11-29 DIAGNOSIS — O10012 Pre-existing essential hypertension complicating pregnancy, second trimester: Secondary | ICD-10-CM

## 2021-11-29 DIAGNOSIS — O219 Vomiting of pregnancy, unspecified: Secondary | ICD-10-CM | POA: Insufficient documentation

## 2021-11-29 DIAGNOSIS — O0992 Supervision of high risk pregnancy, unspecified, second trimester: Secondary | ICD-10-CM

## 2021-11-29 DIAGNOSIS — Z6841 Body Mass Index (BMI) 40.0 and over, adult: Secondary | ICD-10-CM | POA: Insufficient documentation

## 2021-11-29 DIAGNOSIS — I10 Essential (primary) hypertension: Secondary | ICD-10-CM | POA: Insufficient documentation

## 2021-11-29 DIAGNOSIS — G932 Benign intracranial hypertension: Secondary | ICD-10-CM

## 2021-11-29 DIAGNOSIS — O0990 Supervision of high risk pregnancy, unspecified, unspecified trimester: Secondary | ICD-10-CM

## 2021-11-29 DIAGNOSIS — Z3A17 17 weeks gestation of pregnancy: Secondary | ICD-10-CM

## 2021-11-29 DIAGNOSIS — O10919 Unspecified pre-existing hypertension complicating pregnancy, unspecified trimester: Secondary | ICD-10-CM | POA: Insufficient documentation

## 2021-11-29 MED ORDER — LABETALOL HCL 100 MG PO TABS: 100.0000 mg | ORAL_TABLET | Freq: Two times a day (BID) | ORAL | 1 refills | Status: DC

## 2021-11-29 MED ORDER — MAGNESIUM OXIDE 400 MG PO CAPS: 1.0000 | ORAL_CAPSULE | Freq: Every day | ORAL | 1 refills | Status: DC | PRN

## 2021-11-29 MED ORDER — ONDANSETRON HCL 4 MG PO TABS: 4.0000 mg | ORAL_TABLET | Freq: Three times a day (TID) | ORAL | 0 refills | Status: DC | PRN

## 2021-11-29 NOTE — Progress Notes (Signed)
? ?  PRENATAL VISIT NOTE ? ?Subjective:  ?Carmen Reid is a 26 y.o. G1P0000 at [redacted]w[redacted]d being seen today for ongoing prenatal care.  She is currently monitored for the following issues for this high-risk pregnancy and has PCOS (polycystic ovarian syndrome); Anxiety and depression; HLD (hyperlipidemia); Pseudotumor cerebri; Bilateral lower extremity edema; Supervision of high risk pregnancy, antepartum; Obesity affecting pregnancy in first trimester; Chronic hypertension; BMI 40.0-44.9, adult (HCC); and Nausea and vomiting during pregnancy on their problem list. ? ?Patient reports  nausea with pregnancy, occasional headaches .  Contractions: Not present. Vag. Bleeding: None.  Movement: Absent. Denies leaking of fluid.  ? ?The following portions of the patient's history were reviewed and updated as appropriate: allergies, current medications, past family history, past medical history, past social history, past surgical history and problem list.  ? ?Objective:  ? ?Vitals:  ? 11/29/21 1355  ?BP: (!) 153/88  ?Pulse: (!) 116  ?Weight: 235 lb (106.6 kg)  ? ? ?Fetal Status: Fetal Heart Rate (bpm): 140   Movement: Absent    ? ?General:  Alert, oriented and cooperative. Patient is in no acute distress.  ?Skin: Skin is warm and dry. No rash noted.   ?Cardiovascular: Normal heart rate noted  ?Respiratory: Normal respiratory effort, no problems with respiration noted  ?Abdomen: Soft, gravid, appropriate for gestational age.  Pain/Pressure: Present     ?Pelvic: EGBUS, vagina and cervix normal. Cervix cl/long/high  ?Extremities: Normal range of motion.  Edema: Moderate pitting, indentation subsides rapidly. Compression stockings on.   ?Mental Status: Normal mood and affect. Normal behavior. Normal judgment and thought content.  ? ?Assessment and Plan:  ?Pregnancy: G1P0000 at [redacted]w[redacted]d ?1. Supervision of high risk pregnancy, antepartum ?Has anatomy u/s already scheduled ?- AFP, Serum, Open Spina Bifida ? ?2. Chronic  hypertension ?Pt states BPs at home are in the low mild range. Will start labetalol 100 bid an pt amenable to doing low dose asa. D/w her re: plan of care with pregnancy ? ?3. Obesity affecting pregnancy in first trimester ? ?4. BMI 40.0-44.9, adult (HCC) ?6lbs loss since last visit. See below ? ?5. [redacted] weeks gestation of pregnancy ? ?6. 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 ?7. Screening for cervical cancer ?- Cytology - PAP ? ?8. Pseudotumor cerebri ?Has occasional headaches. Will try OTC mg. If no help, then may need neuro referral.  ? ?Preterm labor symptoms and general obstetric precautions including but not limited to vaginal bleeding, contractions, leaking of fluid and fetal movement were reviewed in detail with the patient. ?Please refer to After Visit Summary for other counseling recommendations.  ? ?Return today (on 11/29/2021) for 7-10d rn, virutal visit bp check. 1 month in person ob visit w/ md. ? ?Future Appointments  ?Date Time Provider Department Center  ?12/10/2021  1:50 PM [facility] NURSE [facility]  ?12/11/2021  2:30 PM [facility] NURSE [facility]  ?12/11/2021  2:45 PM [facility] US4 [facility]  ?01/15/2022  3:50 PM Constant, Gigi Gin, MD [facility]  ?01/17/2022  2:00 PM Baity, [name], NP [facility]  ? ? ?[place] Bing, MD ? ?

## 2021-11-29 NOTE — Progress Notes (Signed)
ROB [redacted]w[redacted]d ? ?Pt notes not having desire to eat and vomiting in the morning.  ?B/P at home has been elevated  ? ? ? ?

## 2021-12-01 LAB — AFP, SERUM, OPEN SPINA BIFIDA
AFP MoM: 1.16
AFP Value: 35 ng/mL
Gest. Age on Collection Date: 17 weeks
Maternal Age At EDD: 26.6 yr
OSBR Risk 1 IN: 7366
Test Results:: NEGATIVE
Weight: 235 [lb_av]

## 2021-12-02 ENCOUNTER — Emergency Department (HOSPITAL_COMMUNITY)
Admission: EM | Admit: 2021-12-02 | Discharge: 2021-12-02 | Discharge status: Against Medical Advice | Payer: Medicaid Other

## 2021-12-02 ENCOUNTER — Other Ambulatory Visit: Payer: Self-pay

## 2021-12-02 ENCOUNTER — Inpatient Hospital Stay (HOSPITAL_COMMUNITY)
Admission: AD | Admit: 2021-12-02 | Discharge: 2021-12-02 | Disposition: A | Discharge status: Home / Self Care | Payer: Medicaid Other | Attending: Obstetrics and Gynecology | Admitting: Obstetrics and Gynecology

## 2021-12-02 NOTE — ED Notes (Signed)
Patient called x3 for triage without answer. 

## 2021-12-03 ENCOUNTER — Ambulatory Visit (INDEPENDENT_AMBULATORY_CARE_PROVIDER_SITE_OTHER): Payer: Medicaid Other | Admitting: Family Medicine

## 2021-12-03 VITALS — BP 139/89 | HR 102 | Wt 236.0 lb

## 2021-12-03 DIAGNOSIS — R519 Headache, unspecified: Secondary | ICD-10-CM

## 2021-12-03 DIAGNOSIS — G932 Benign intracranial hypertension: Secondary | ICD-10-CM

## 2021-12-03 DIAGNOSIS — O0990 Supervision of high risk pregnancy, unspecified, unspecified trimester: Secondary | ICD-10-CM

## 2021-12-03 DIAGNOSIS — O99211 Obesity complicating pregnancy, first trimester: Secondary | ICD-10-CM

## 2021-12-03 DIAGNOSIS — I10 Essential (primary) hypertension: Secondary | ICD-10-CM

## 2021-12-03 MED ORDER — CYCLOBENZAPRINE HCL 10 MG PO TABS: 10.0000 mg | ORAL_TABLET | Freq: Three times a day (TID) | ORAL | 1 refills | Status: DC | PRN

## 2021-12-03 MED ORDER — ACETAMINOPHEN 500 MG PO TABS: 500.0000 mg | ORAL_TABLET | Freq: Four times a day (QID) | ORAL | 0 refills | Status: DC | PRN

## 2021-12-03 MED ORDER — DIPHENHYDRAMINE HCL 50 MG PO CAPS: 50.0000 mg | ORAL_CAPSULE | Freq: Four times a day (QID) | ORAL | 0 refills | Status: DC | PRN

## 2021-12-03 NOTE — Patient Instructions (Signed)
Drink 64oz water ?Take Benadryl 50mg  (two of the over the counter medication) ?Take Extra-Strength Tylenol (each pill is 500mg , you should take 2 of them to equal 1000mg ) ?Take Flexeril - prescribed.  ? ?You will be sleepy!! ? ?If you headaches worsens, go to hospital ?If you headache not improved by tomorrow go to hospital ? ?

## 2021-12-03 NOTE — Progress Notes (Signed)
ROB presents for problem visit. ? ?CC: headaches since Friday. Went to [facility]U no rooms were ava. ?Pt notes left eye to be blurry and swelling in left foot.  ?Pt states she has never had migraines before.  ? ? ?

## 2021-12-03 NOTE — Progress Notes (Signed)
? ?PRENATAL VISIT NOTE ? ?Subjective:  ?Carmen Reid is a 26 y.o. G1P0000 at [redacted]w[redacted]d being seen today for ongoing prenatal care.  She is currently monitored for the following issues for this high-risk pregnancy and has PCOS (polycystic ovarian syndrome); Anxiety and depression; HLD (hyperlipidemia); Pseudotumor cerebri; Bilateral lower extremity edema; Supervision of high risk pregnancy, antepartum; Obesity affecting pregnancy in first trimester; Chronic hypertension; BMI 40.0-44.9, adult (HCC); and Nausea and vomiting during pregnancy on their problem list. ? ?Patient reports headache-- started over the weekend. Associated with left eye vision loss with changes in position/bending. Reports pain was worse behind left eye on Sunday and has improved. She tried to go to the MAU but there was a wait. She denies loss of sensation or function. She reports she tried one tylenol. She has not tried anything else. She is squinting in the room due to sensitivity to light. 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  Contractions: Not present. Vag. Bleeding: None.  Movement: Absent. Denies leaking of fluid.  ? ?The following portions of the patient's history were reviewed and updated as appropriate: allergies, current medications, past family history, past medical history, past social history, past surgical history and problem list.  ? ?Objective:  ? ?Vitals:  ? 12/03/21 1600  ?BP: 139/89  ?Pulse: (!) 102  ?Weight: 236 lb (107 kg)  ? ? ?Fetal Status: Fetal Heart Rate (bpm): 154   Movement: Absent    ? ?General:  Alert, oriented and cooperative. Patient is in no acute distress.  ?Skin: Skin is warm and dry. No rash noted.   ?Cardiovascular: Normal heart rate noted  ?Respiratory: Normal respiratory effort, no problems with respiration noted  ?Abdomen: Soft, gravid, appropriate for gestational age.  Pain/Pressure: Absent     ?Pelvic: Cervical exam deferred        ?Extremities: Normal range of motion.     ?Mental Status:  Normal mood and affect. Normal behavior. Normal judgment and thought content.  ? ?Neuro: CN 2-12 are grossly intact. Walking normally. No balance disturbance ?Assessment and Plan:  ?Pregnancy: G1P0000 at [redacted]w[redacted]d ?1. Chronic hypertension ?Continue labetalol ? ?2. Obesity affecting pregnancy in first trimester ?TWG=-14 lb (-6.35 kg)  ? ?3. Pseudotumor cerebri ?Concerning-- see below ? ?4. Supervision of high risk pregnancy, antepartum ? ?5. Acute intractable headache, unspecified headache type ?Concerning with worse headache in her life-- no acute neuro symptoms in the office today. I expressed concern regarding her loss of vision. I recommended urgent/emergent assessment at MAU or ER for CT scan. The patient feels she is unable to go today because of transportation.  We discussed that I cannot guarantee that this is a simple headache vs more serious causes which include but are not limited to increased intercranial pressure for pseudotumor. I am not currently concerned about bleeding in her brain given her benign neuro exam.  I do think an MRI is warranted to assess her pseudotumor cerebri is appropriate but would recommend this urgent/emergent.  ? ?Patient has also not fully failed outpatient management for headache. I recommend the below to see if this helps her HA:  ?- cyclobenzaprine (FLEXERIL) 10 MG tablet; Take 1 tablet (10 mg total) by mouth every 8 (eight) hours as needed for muscle spasms.  Dispense: 30 tablet; Refill: 1 ?- acetaminophen (TYLENOL) 500 MG tablet; Take 1 tablet (500 mg total) by mouth every 6 (six) hours as needed.  Dispense: 30 tablet; Refill: 0 ?- diphenhydrAMINE (BENADRYL) 50 MG capsule; Take 1 capsule (50 mg total)  by mouth every 6 (six) hours as needed.  Dispense: 30 capsule; Refill: 0 ? ?Discussed in explicit detail and given written instructions that if her headache worsens or is not improved by tomorrow I recommend urgent/emergent evaluation with head imaging. Patieng voice  understanding.  ? ?Preterm labor symptoms and general obstetric precautions including but not limited to vaginal bleeding, contractions, leaking of fluid and fetal movement were reviewed in detail with the patient. ?Please refer to After Visit Summary for other counseling recommendations.  ? ?No follow-ups on file. ? ?Future Appointments  ?Date Time Provider Rudd  ?12/10/2021  1:50 PM [facility] NURSE [facility]  ?12/11/2021  2:30 PM [facility] NURSE [facility]  ?12/11/2021  2:45 PM [facility] US4 [facility]  ?01/15/2022  3:50 PM Constant, Vickii Chafe, MD [facility]  ?01/17/2022  2:00 PM Baity, Coralie Keens, NP [facility]  ? ? ?Caren Macadam, MD ? ?

## 2021-12-04 LAB — CYTOLOGY - PAP: Diagnosis: NEGATIVE

## 2021-12-10 ENCOUNTER — Ambulatory Visit: Payer: Medicaid Other

## 2021-12-10 ENCOUNTER — Encounter: Payer: Self-pay | Admitting: Family Medicine

## 2021-12-10 ENCOUNTER — Other Ambulatory Visit: Payer: Self-pay | Admitting: Family Medicine

## 2021-12-10 DIAGNOSIS — I10 Essential (primary) hypertension: Secondary | ICD-10-CM

## 2021-12-10 MED ORDER — LABETALOL HCL 200 MG PO TABS: 100.0000 mg | ORAL_TABLET | Freq: Two times a day (BID) | ORAL | 2 refills | Status: DC

## 2021-12-11 ENCOUNTER — Ambulatory Visit: Payer: Medicaid Other | Admitting: *Deleted

## 2021-12-11 ENCOUNTER — Other Ambulatory Visit: Payer: Self-pay | Admitting: *Deleted

## 2021-12-11 ENCOUNTER — Ambulatory Visit: Payer: Medicaid Other | Attending: Family Medicine

## 2021-12-11 ENCOUNTER — Ambulatory Visit (HOSPITAL_BASED_OUTPATIENT_CLINIC_OR_DEPARTMENT_OTHER): Payer: Medicaid Other | Admitting: Obstetrics

## 2021-12-11 VITALS — BP 139/78 | HR 104

## 2021-12-11 DIAGNOSIS — O10012 Pre-existing essential hypertension complicating pregnancy, second trimester: Secondary | ICD-10-CM | POA: Diagnosis not present

## 2021-12-11 DIAGNOSIS — Z363 Encounter for antenatal screening for malformations: Secondary | ICD-10-CM | POA: Diagnosis present

## 2021-12-11 DIAGNOSIS — O358XX0 Maternal care for other (suspected) fetal abnormality and damage, not applicable or unspecified: Secondary | ICD-10-CM | POA: Insufficient documentation

## 2021-12-11 DIAGNOSIS — O10912 Unspecified pre-existing hypertension complicating pregnancy, second trimester: Secondary | ICD-10-CM

## 2021-12-11 DIAGNOSIS — O35BXX0 Maternal care for other (suspected) fetal abnormality and damage, fetal cardiac anomalies, not applicable or unspecified: Secondary | ICD-10-CM | POA: Diagnosis not present

## 2021-12-11 DIAGNOSIS — Z3A19 19 weeks gestation of pregnancy: Secondary | ICD-10-CM

## 2021-12-11 DIAGNOSIS — O99212 Obesity complicating pregnancy, second trimester: Secondary | ICD-10-CM | POA: Diagnosis not present

## 2021-12-11 DIAGNOSIS — Z3401 Encounter for supervision of normal first pregnancy, first trimester: Secondary | ICD-10-CM

## 2021-12-11 DIAGNOSIS — O0990 Supervision of high risk pregnancy, unspecified, unspecified trimester: Secondary | ICD-10-CM

## 2021-12-11 NOTE — Progress Notes (Signed)
MFM Note ? ?Carmen Reid was seen for a detailed fetal anatomy scan due to maternal obesity with a BMI of 43 and chronic hypertension treated with labetalol 200 mg twice a day. ? ?She denies any problems in her current pregnancy.   ? ?She had a cell free DNA test earlier in her pregnancy which indicated a low risk for trisomy 58, 10, and 13. A female fetus is predicted.  ? ?She was informed that the fetal growth and amniotic fluid level were appropriate for her gestational age.  ? ?On today's exam, an intracardiac echogenic focus was noted in the left ventricle of the fetal heart.   ? ?The small association between an echogenic focus and Down syndrome was discussed.  ? ?Due to the echogenic focus noted today, the patient was offered and declined an amniocentesis today for definitive diagnosis of fetal aneuploidy.  She reports that she is comfortable with her negative cell free DNA test. ? ?The views of the fetal anatomy were limited today due to the fetal position and maternal body habitus. ? ?The patient was informed that anomalies may be missed due to technical limitations. If the fetus is in a suboptimal position or maternal habitus is increased, visualization of the fetus in the maternal uterus may be impaired. ? ?The following were discussed with the patient today: ? ?Chronic hypertension in pregnancy ? ?The implications and management of chronic hypertension in pregnancy was discussed.  ? ?The patient was advised that should her blood pressures continue to be elevated later in pregnancy, the dosage of her antihypertensive medications may need to be increased.   ? ?The increased risk of superimposed preeclampsia, an indicated preterm delivery, and possible fetal growth restriction due to chronic hypertension in pregnancy was discussed.  ? ?We will continue to follow her with monthly growth scans.  ? ?Weekly fetal testing should be started at around 32 weeks.  ? ?To decrease her risk of superimposed preeclampsia,  she should continue taking a daily baby aspirin (81 mg daily) for preeclampsia prophylaxis.  ? ?A follow-up exam was scheduled in 4 weeks to complete the views of the fetal anatomy and to assess the fetal growth.   ? ?The patient stated that all of her questions were answered today. ? ?A total of 30 minutes was spent counseling and coordinating the care for this patient.  Greater than 50% of the time was spent in direct face-to-face contact. ?

## 2021-12-13 ENCOUNTER — Encounter: Payer: Self-pay | Admitting: Family Medicine

## 2021-12-26 ENCOUNTER — Other Ambulatory Visit: Payer: Self-pay | Admitting: *Deleted

## 2021-12-26 DIAGNOSIS — I10 Essential (primary) hypertension: Secondary | ICD-10-CM

## 2021-12-26 MED ORDER — LABETALOL HCL 200 MG PO TABS: 200.0000 mg | ORAL_TABLET | Freq: Two times a day (BID) | ORAL | 2 refills | Status: DC

## 2022-01-09 ENCOUNTER — Ambulatory Visit: Payer: Commercial Managed Care - HMO | Admitting: *Deleted

## 2022-01-09 ENCOUNTER — Ambulatory Visit: Payer: Commercial Managed Care - HMO | Attending: Obstetrics

## 2022-01-09 VITALS — BP 146/84 | HR 95

## 2022-01-09 DIAGNOSIS — O10912 Unspecified pre-existing hypertension complicating pregnancy, second trimester: Secondary | ICD-10-CM | POA: Diagnosis present

## 2022-01-09 DIAGNOSIS — O10919 Unspecified pre-existing hypertension complicating pregnancy, unspecified trimester: Secondary | ICD-10-CM | POA: Insufficient documentation

## 2022-01-09 DIAGNOSIS — O10012 Pre-existing essential hypertension complicating pregnancy, second trimester: Secondary | ICD-10-CM | POA: Diagnosis not present

## 2022-01-09 DIAGNOSIS — O99212 Obesity complicating pregnancy, second trimester: Secondary | ICD-10-CM | POA: Diagnosis not present

## 2022-01-09 DIAGNOSIS — Z3A23 23 weeks gestation of pregnancy: Secondary | ICD-10-CM

## 2022-01-09 DIAGNOSIS — O35BXX0 Maternal care for other (suspected) fetal abnormality and damage, fetal cardiac anomalies, not applicable or unspecified: Secondary | ICD-10-CM

## 2022-01-09 DIAGNOSIS — E669 Obesity, unspecified: Secondary | ICD-10-CM | POA: Diagnosis not present

## 2022-01-10 ENCOUNTER — Other Ambulatory Visit: Payer: Self-pay | Admitting: *Deleted

## 2022-01-10 DIAGNOSIS — O10912 Unspecified pre-existing hypertension complicating pregnancy, second trimester: Secondary | ICD-10-CM

## 2022-01-10 DIAGNOSIS — O99212 Obesity complicating pregnancy, second trimester: Secondary | ICD-10-CM

## 2022-01-10 DIAGNOSIS — O283 Abnormal ultrasonic finding on antenatal screening of mother: Secondary | ICD-10-CM

## 2022-01-15 ENCOUNTER — Ambulatory Visit (INDEPENDENT_AMBULATORY_CARE_PROVIDER_SITE_OTHER): Payer: Medicaid Other | Admitting: Obstetrics and Gynecology

## 2022-01-15 ENCOUNTER — Encounter: Payer: Self-pay | Admitting: Obstetrics and Gynecology

## 2022-01-15 VITALS — BP 123/80 | HR 87 | Wt 236.0 lb

## 2022-01-15 DIAGNOSIS — O0990 Supervision of high risk pregnancy, unspecified, unspecified trimester: Secondary | ICD-10-CM

## 2022-01-15 DIAGNOSIS — I10 Essential (primary) hypertension: Secondary | ICD-10-CM

## 2022-01-15 DIAGNOSIS — O10919 Unspecified pre-existing hypertension complicating pregnancy, unspecified trimester: Secondary | ICD-10-CM

## 2022-01-15 MED ORDER — PANTOPRAZOLE SODIUM 40 MG PO TBEC: 40.0000 mg | DELAYED_RELEASE_TABLET | Freq: Every day | ORAL | 3 refills | Status: DC

## 2022-01-15 MED ORDER — LABETALOL HCL 200 MG PO TABS: 200.0000 mg | ORAL_TABLET | Freq: Two times a day (BID) | ORAL | 2 refills | Status: DC

## 2022-01-15 NOTE — Progress Notes (Signed)
ROB 24w  CC: Acid Reflux pt would like Rx.  Pt needs refill on B/P medication.

## 2022-01-15 NOTE — Progress Notes (Signed)
   PRENATAL VISIT NOTE  Subjective:  Carmen Reid is a 26 y.o. G1P0000 at [redacted]w[redacted]d being seen today for ongoing prenatal care.  She is currently monitored for the following issues for this high-risk pregnancy and has PCOS (polycystic ovarian syndrome); Anxiety and depression; HLD (hyperlipidemia); Pseudotumor cerebri; Bilateral lower extremity edema; Supervision of high risk pregnancy, antepartum; Obesity affecting pregnancy in first trimester; Chronic hypertension during pregnancy, antepartum; BMI 40.0-44.9, adult ([place]); and Nausea and vomiting during pregnancy on their problem list.  Patient reports no complaints.  Contractions: Not present. Vag. Bleeding: None.  Movement: Present. Denies leaking of fluid.   The following portions of the patient's history were reviewed and updated as appropriate: allergies, current medications, past family history, past medical history, past social history, past surgical history and problem list.   Objective:   Vitals:   01/15/22 1604  BP: 123/80  Pulse: 87  Weight: 236 lb (107 kg)    Fetal Status: Fetal Heart Rate (bpm): 134 Fundal Height: 24 cm Movement: Present     General:  Alert, oriented and cooperative. Patient is in no acute distress.  Skin: Skin is warm and dry. No rash noted.   Cardiovascular: Normal heart rate noted  Respiratory: Normal respiratory effort, no problems with respiration noted  Abdomen: Soft, gravid, appropriate for gestational age.  Pain/Pressure: Absent     Pelvic: Cervical exam deferred        Extremities: Normal range of motion.  Edema: Deep pitting, indentation remains for a short time  Mental Status: Normal mood and affect. Normal behavior. Normal judgment and thought content.   Assessment and Plan:  Pregnancy: G1P0000 at [redacted]w[redacted]d 1. Supervision of high risk pregnancy, antepartum Patient is doing well without complaints Third trimester labs and glucola next visit Follow up incomplete anatomy end of  June  2. Chronic hypertension during pregnancy, antepartum Normotensive Continue labetalol and asa - labetalol (NORMODYNE) 200 MG tablet; Take 1 tablet (200 mg total) by mouth 2 (two) times daily.  Dispense: 60 tablet; Refill: 2  Preterm labor symptoms and general obstetric precautions including but not limited to vaginal bleeding, contractions, leaking of fluid and fetal movement were reviewed in detail with the patient. Please refer to After Visit Summary for other counseling recommendations.   Return in about 4 weeks (around 02/12/2022) for in person, ROB, Low risk, 2 hr glucola next visit.  Future Appointments  Date Time Provider [place]  02/14/2022  3:30 PM [facility] NURSE [facility]  02/14/2022  3:45 PM [facility] US1 [facility]    Mora Bellman, MD

## 2022-01-17 ENCOUNTER — Ambulatory Visit: Payer: Self-pay | Admitting: Internal Medicine

## 2022-02-14 ENCOUNTER — Encounter: Payer: Self-pay | Admitting: Advanced Practice Midwife

## 2022-02-14 ENCOUNTER — Ambulatory Visit: Payer: Commercial Managed Care - HMO | Attending: Obstetrics and Gynecology

## 2022-02-14 ENCOUNTER — Ambulatory Visit (INDEPENDENT_AMBULATORY_CARE_PROVIDER_SITE_OTHER): Payer: Commercial Managed Care - HMO | Admitting: Advanced Practice Midwife

## 2022-02-14 ENCOUNTER — Ambulatory Visit: Payer: Commercial Managed Care - HMO | Admitting: *Deleted

## 2022-02-14 VITALS — BP 133/89 | HR 91 | Wt 239.0 lb

## 2022-02-14 VITALS — BP 140/83

## 2022-02-14 DIAGNOSIS — G932 Benign intracranial hypertension: Secondary | ICD-10-CM

## 2022-02-14 DIAGNOSIS — O10919 Unspecified pre-existing hypertension complicating pregnancy, unspecified trimester: Secondary | ICD-10-CM | POA: Insufficient documentation

## 2022-02-14 DIAGNOSIS — O35BXX0 Maternal care for other (suspected) fetal abnormality and damage, fetal cardiac anomalies, not applicable or unspecified: Secondary | ICD-10-CM | POA: Diagnosis not present

## 2022-02-14 DIAGNOSIS — O99213 Obesity complicating pregnancy, third trimester: Secondary | ICD-10-CM

## 2022-02-14 DIAGNOSIS — O0990 Supervision of high risk pregnancy, unspecified, unspecified trimester: Secondary | ICD-10-CM

## 2022-02-14 DIAGNOSIS — O283 Abnormal ultrasonic finding on antenatal screening of mother: Secondary | ICD-10-CM | POA: Insufficient documentation

## 2022-02-14 DIAGNOSIS — E669 Obesity, unspecified: Secondary | ICD-10-CM | POA: Diagnosis not present

## 2022-02-14 DIAGNOSIS — Z3A28 28 weeks gestation of pregnancy: Secondary | ICD-10-CM | POA: Diagnosis not present

## 2022-02-14 DIAGNOSIS — O10912 Unspecified pre-existing hypertension complicating pregnancy, second trimester: Secondary | ICD-10-CM | POA: Insufficient documentation

## 2022-02-14 DIAGNOSIS — O10013 Pre-existing essential hypertension complicating pregnancy, third trimester: Secondary | ICD-10-CM | POA: Diagnosis not present

## 2022-02-14 DIAGNOSIS — O99210 Obesity complicating pregnancy, unspecified trimester: Secondary | ICD-10-CM

## 2022-02-14 DIAGNOSIS — O99212 Obesity complicating pregnancy, second trimester: Secondary | ICD-10-CM | POA: Insufficient documentation

## 2022-02-14 NOTE — Progress Notes (Signed)
ROB [redacted]w[redacted]d  Tdap offered: Declined   CC: None

## 2022-02-14 NOTE — Progress Notes (Signed)
PRENATAL VISIT NOTE  Subjective:  Carmen Reid is a 26 y.o. G1P0000 at 26w2dbeing seen today for ongoing prenatal care.  She is currently monitored for the following issues for this low-risk pregnancy and has PCOS (polycystic ovarian syndrome); Anxiety and depression; HLD (hyperlipidemia); Pseudotumor cerebri; Bilateral lower extremity edema; Supervision of high risk pregnancy, antepartum; Obesity affecting pregnancy in first trimester; Chronic hypertension during pregnancy, antepartum; BMI 40.0-44.9, [place]; and Nausea and vomiting during pregnancy on their problem list.  Patient reports no complaints.  Contractions: Not present. Vag. Bleeding: None.  Movement: Present. Denies leaking of fluid.   Patient endorses history of headache with loss of visual field. She has not had any headaches or changes in her visual field since she met with Dr. NErnestina Patches04/17/2023. Patient states she was evaluated in 2016 but subsequently lost her health insurance coverage for a prolonged period of time and was unable to pursue outpatient follow-up.  The following portions of the patient's history were reviewed and updated as appropriate: allergies, current medications, past family history, past medical history, past social history, past surgical history and problem list. Problem list updated.  Objective:   Vitals:   02/14/22 0849  BP: 133/89  Pulse: 91  Weight: 239 lb (108.4 kg)    Fetal Status: Fetal Heart Rate (bpm): 148   Movement: Present     General:  Alert, oriented and cooperative. Patient is in no acute distress.  Skin: Skin is warm and dry. No rash noted.   Cardiovascular: Normal heart rate noted  Respiratory: Normal respiratory effort, no problems with respiration noted  Abdomen: Soft, gravid, appropriate for gestational age.  Pain/Pressure: Present     Pelvic: Cervical exam deferred        Extremities: Normal range of motion.  Dependent edema WNL  Mental Status: Normal  mood and affect. Normal behavior. Normal judgment and thought content.   Assessment and Plan:  Pregnancy: G1P0000 at 284w2d1. Supervision of high risk pregnancy, antepartum - Routine care, preemptive discussion: interventions if abnormal, GTT results - Interested in doula, will send information to coordinator - TDAP declined - Glucose Tolerance, 2 Hours w/1 Hour - CBC - RPR - HIV Antibody (routine testing w rflx)  2. Chronic hypertension during pregnancy, antepartum - Compliant with Labetalol 200 mg BID and bASA daily  3. Pseudotumor cerebri - MRI from 2016 reviewed, mentions "The constellation of findings could reflect idiopathic intracranial hypertension, and correlation with LP and opening pressure is suggested" - Advised updated assessment in anticipation of physiologic changes in third trimester, absence of recent evaluation - Ambulatory referral to Neurology  4. [redacted] weeks gestation of pregnancy   5. Obesity in pregnancy - Pregravid BMI 42.89 - Reviewed MFM recommendations for surveillance in third trimester and delivery timing  Preterm labor symptoms and general obstetric precautions including but not limited to vaginal bleeding, contractions, leaking of fluid and fetal movement were reviewed in detail with the patient. Please refer to After Visit Summary for other counseling recommendations.  Return in about 4 weeks (around 03/14/2022).  Future Appointments  Date Time Provider DeCurrie6/29/2023  3:30 PM [facility]6/29/2023  3:45 PM [facility] US1 [facility]7/13/2023  3:50 PM WeDarlina RumpfCNM [facility]  03/12/2022  3:50 PM Anyanwu, UgSallyanne HaversMD [facility]  03/26/2022  3:50 PM Anyanwu, UgSallyanne HaversMD [facility]  04/10/2022  3:50 PM PrDonnamae JudeMD [facility]  04/17/2022  4:10 PM Donnamae Jude, MD [facility]  04/24/2022  4:10 PM Donnamae Jude, MD [facility]  05/01/2022   4:10 PM Donnamae Jude, MD [facility]  05/07/2022  3:30 PM Constant, Vickii Chafe, MD [place], CNM

## 2022-02-15 ENCOUNTER — Other Ambulatory Visit: Payer: Self-pay | Admitting: *Deleted

## 2022-02-15 DIAGNOSIS — O99213 Obesity complicating pregnancy, third trimester: Secondary | ICD-10-CM

## 2022-02-15 DIAGNOSIS — O10913 Unspecified pre-existing hypertension complicating pregnancy, third trimester: Secondary | ICD-10-CM

## 2022-02-15 LAB — RPR: RPR Ser Ql: NONREACTIVE

## 2022-02-15 LAB — CBC
Hematocrit: 36.8 % (ref 34.0–46.6)
Hemoglobin: 12 g/dL (ref 11.1–15.9)
MCH: 28.6 pg (ref 26.6–33.0)
MCHC: 32.6 g/dL (ref 31.5–35.7)
MCV: 88 fL (ref 79–97)
Platelets: 309 10*3/uL (ref 150–450)
RBC: 4.19 x10E6/uL (ref 3.77–5.28)
RDW: 13.5 % (ref 11.7–15.4)
WBC: 11.9 10*3/uL — ABNORMAL HIGH (ref 3.4–10.8)

## 2022-02-15 LAB — HIV ANTIBODY (ROUTINE TESTING W REFLEX): HIV Screen 4th Generation wRfx: NONREACTIVE

## 2022-02-15 LAB — GLUCOSE TOLERANCE, 2 HOURS W/ 1HR
Glucose, 1 hour: 113 mg/dL (ref 70–179)
Glucose, 2 hour: 84 mg/dL (ref 70–152)
Glucose, Fasting: 78 mg/dL (ref 70–91)

## 2022-02-28 ENCOUNTER — Encounter: Payer: Self-pay | Admitting: Advanced Practice Midwife

## 2022-02-28 ENCOUNTER — Ambulatory Visit (INDEPENDENT_AMBULATORY_CARE_PROVIDER_SITE_OTHER): Payer: Commercial Managed Care - HMO | Admitting: Advanced Practice Midwife

## 2022-02-28 VITALS — BP 141/88 | HR 89 | Wt 242.0 lb

## 2022-02-28 DIAGNOSIS — Z6841 Body Mass Index (BMI) 40.0 and over, adult: Secondary | ICD-10-CM

## 2022-02-28 DIAGNOSIS — G932 Benign intracranial hypertension: Secondary | ICD-10-CM

## 2022-02-28 DIAGNOSIS — O10919 Unspecified pre-existing hypertension complicating pregnancy, unspecified trimester: Secondary | ICD-10-CM

## 2022-02-28 DIAGNOSIS — O0990 Supervision of high risk pregnancy, unspecified, unspecified trimester: Secondary | ICD-10-CM

## 2022-02-28 DIAGNOSIS — Z3A30 30 weeks gestation of pregnancy: Secondary | ICD-10-CM

## 2022-02-28 DIAGNOSIS — O0993 Supervision of high risk pregnancy, unspecified, third trimester: Secondary | ICD-10-CM

## 2022-02-28 MED ORDER — LABETALOL HCL 200 MG PO TABS: 200.0000 mg | ORAL_TABLET | Freq: Three times a day (TID) | ORAL | 0 refills | Status: DC

## 2022-02-28 NOTE — Progress Notes (Signed)
ROB [redacted]w[redacted]d  Pt declines any visual changes or HA's  CC: swelling in legs

## 2022-03-01 NOTE — Progress Notes (Signed)
PRENATAL VISIT NOTE  Subjective:  Carmen Reid is a 26 y.o. G1P0000 at [redacted]w[redacted]d being seen today for ongoing prenatal care.  She is currently monitored for the following issues for this high-risk pregnancy and has PCOS (polycystic ovarian syndrome); Anxiety and depression; HLD (hyperlipidemia); Pseudotumor cerebri; Bilateral lower extremity edema; Supervision of high risk pregnancy, antepartum; Obesity affecting pregnancy in first trimester; Chronic hypertension during pregnancy, antepartum; BMI 40.0-44.9, adult (HCC); and Nausea and vomiting during pregnancy on their problem list.  Patient reports  ongoing fatigue with Labetalol regimen .  Contractions: Not present. Vag. Bleeding: None.  Movement: Present. Denies leaking of fluid. Patient also reports bilateral lower extremity edema.  Patient states she pursued the Neurology referral I gave her, but was told by her previous practice she could not be seen until she paid her $800 bill from previous episodes of care. Patient and her mom request referral to [facility] or [facility] for Neurology evaluation.  The following portions of the patient's history were reviewed and updated as appropriate: allergies, current medications, past family history, past medical history, past social history, past surgical history and problem list. Problem list updated.  Objective:   Vitals:   02/28/22 1600  BP: (!) 141/88  Pulse: 89  Weight: 242 lb (109.8 kg)    Fetal Status: Fetal Heart Rate (bpm): 144   Movement: Present     General:  Alert, oriented and cooperative. Patient is in no acute distress.  Skin: Skin is warm and dry. No rash noted.   Cardiovascular: Normal heart rate noted  Respiratory: Normal respiratory effort, no problems with respiration noted  Abdomen: Soft, gravid, appropriate for gestational age.  Pain/Pressure: Present     Pelvic: Cervical exam deferred        Extremities: Normal range of motion.  Edema: Very deep pitting,  indentation lasts a long time  Mental Status: Normal mood and affect. Normal behavior. Normal judgment and thought content.   Assessment and Plan:  Pregnancy: G1P0000 at [redacted]w[redacted]d  1. Supervision of high risk pregnancy, antepartum - Bilateral lower extremity edema WNL for 30+2  2. Chronic hypertension during pregnancy, antepartum - Currently Labetalol 200 mg BID - BP's consistently 140s/80s - Discussed with Dr. Jolayne Panther, who agrees with my recommendation for Labetalol 200 mg TID - Patient to initiate new TID dosing tomorrow, will collect BP on home cuff and send via MyChart Monday 07/17  3. BMI 40.0-44.9, adult (HCC) - Already scheduled for serial surveillance with MFM - Weight stable  4. [redacted] weeks gestation of pregnancy - Advised daily kick counts, reviewed interventions for low kick number, indications for eval in MAU  5. Pseudotumor cerebri - Migraine with vision loss and "known small aneurysm" documented by Dr. Alvester Morin 12/03/2021 - No imaging since 2016.  - Given changes in circulating volume in pregnancy and expectation of Valsalva maneuvers during labor, I continue to feel updated assessment from Neurology is merited and in the patient's best interest - If patient and her mom would like to call other Neurology practices, I am happy to provide any referral letter those practices deem necessary  Report from MRI with Contrast from 05/15/2015:   IMPRESSION: 1. No acute intracranial abnormality or mass. 2. Partially empty sella, mildly prominent bilateral optic nerve sheaths, and possible bilateral transverse sinus stenoses. The constellation of findings could reflect idiopathic intracranial hypertension, and correlation with LP and opening pressure is suggested.  Preterm labor symptoms and general obstetric precautions including but not limited to vaginal bleeding,  contractions, leaking of fluid and fetal movement were reviewed in detail with the patient. Please refer to After  Visit Summary for other counseling recommendations.    Future Appointments  Date Time Provider Department Center  03/12/2022  3:50 PM Tereso Newcomer, MD [facility]  03/15/2022  3:15 PM [facility] NURSE [facility]  03/15/2022  3:30 PM [facility] US2 [facility]  03/22/2022  3:15 PM [facility] NURSE [facility]  03/22/2022  3:30 PM [facility] US3 [facility]  03/26/2022  3:50 PM Anyanwu, Jethro Bastos, MD [facility]  03/29/2022  3:15 PM [facility] NURSE [facility]  03/29/2022  3:30 PM [facility] US2 [facility]  04/10/2022  3:50 PM Reva Bores, MD [facility]  04/17/2022  4:10 PM Reva Bores, MD [facility]  04/24/2022  4:10 PM Reva Bores, MD [facility]  05/01/2022  4:10 PM Reva Bores, MD [facility]  05/07/2022  3:30 PM Constant, Gigi Gin, MD [facility]    Calvert Cantor, CNM

## 2022-03-12 ENCOUNTER — Encounter: Payer: Self-pay | Admitting: Obstetrics & Gynecology

## 2022-03-12 ENCOUNTER — Ambulatory Visit (INDEPENDENT_AMBULATORY_CARE_PROVIDER_SITE_OTHER): Payer: Commercial Managed Care - HMO | Admitting: Obstetrics & Gynecology

## 2022-03-12 VITALS — BP 133/85 | HR 91 | Wt 244.2 lb

## 2022-03-12 DIAGNOSIS — O10919 Unspecified pre-existing hypertension complicating pregnancy, unspecified trimester: Secondary | ICD-10-CM

## 2022-03-12 DIAGNOSIS — Z3A32 32 weeks gestation of pregnancy: Secondary | ICD-10-CM

## 2022-03-12 DIAGNOSIS — O0990 Supervision of high risk pregnancy, unspecified, unspecified trimester: Secondary | ICD-10-CM

## 2022-03-12 NOTE — Patient Instructions (Signed)
Return to office for any scheduled appointments. Call the office or go to the MAU at [facility] if: You begin to have strong, frequent contractions Your water breaks.  Sometimes it is a big gush of fluid, sometimes it is just a trickle that keeps getting your underwear wet or running down your legs You have vaginal bleeding.  It is normal to have a small amount of spotting if your cervix was checked.  You do not feel your baby moving like normal.  If you do not, get something to eat and drink and lay down and focus on feeling your baby move.   If your baby is still not moving like normal, you should call the office or go to MAU. Any other obstetric concerns.  

## 2022-03-12 NOTE — Progress Notes (Signed)
PRENATAL VISIT NOTE  Subjective:  Carmen Reid is a 26 y.o. G1P0000 at [redacted]w[redacted]d being seen today for ongoing prenatal care.  She is currently monitored for the following issues for this high-risk pregnancy and has PCOS (polycystic ovarian syndrome); Anxiety and depression; HLD (hyperlipidemia); Pseudotumor cerebri; Bilateral lower extremity edema; Supervision of high risk pregnancy, antepartum; Maternal morbid obesity, antepartum ([place]); Chronic hypertension during pregnancy, antepartum; BMI 40.0-44.9, adult (HCC); and Nausea and vomiting during pregnancy on their problem list.  Patient reports no complaints.  Contractions: Not present. Vag. Bleeding: None.  Movement: Present. Denies leaking of fluid.   The following portions of the patient's history were reviewed and updated as appropriate: allergies, current medications, past family history, past medical history, past social history, past surgical history and problem list.   Objective:   Vitals:   03/12/22 1547  BP: 133/85  Pulse: 91  Weight: 244 lb 3.2 oz (110.8 kg)    Fetal Status: Fetal Heart Rate (bpm): 150   Movement: Present     General:  Alert, oriented and cooperative. Patient is in no acute distress.  Skin: Skin is warm and dry. No rash noted.   Cardiovascular: Normal heart rate noted  Respiratory: Normal respiratory effort, no problems with respiration noted  Abdomen: Soft, gravid, appropriate for gestational age.  Pain/Pressure: Present     Pelvic: Cervical exam deferred        Extremities: Normal range of motion.  Edema: Very deep pitting, indentation lasts a long time  Mental Status: Normal mood and affect. Normal behavior. Normal judgment and thought content.   Imaging: [place] MFM OB FOLLOW UP  Result Date: 02/15/2022 ----------------------------------------------------------------------  OBSTETRICS REPORT                       (Signed Final 02/15/2022 10:36 am)  ---------------------------------------------------------------------- Patient Info  ID #:       [id number]                          D.O.B.:  1996/07/27 (26 yrs)  Name:       Carmen Reid               Visit Date: 02/14/2022 03:16 pm ---------------------------------------------------------------------- Performed By  Attending:        Sander Nephew      Ref. Address:     [place]  Performed By:     Valda Favia          [facility]  Referred By:      [place] ---------------------------------------------------------------------- Orders  #  Description                           Code        Ordered By  1  [place] MFM OB FOLLOW UP                   [phone]    [place] ----------------------------------------------------------------------  #  Order #                     Accession #                Episode #  1  [id number]                   [id number]                 [id number] ---------------------------------------------------------------------- Indications  Hypertension - Chronic/Pre-existing            O10.019  (labetalol)  Obesity complicating pregnancy, second         O99.212  trimester (BMI 43)  Fetal abnormality - other known or             O35.9XX0  suspected (EIF)  LR NIPS/Neg AFP/ Neg Horizon  Encounter for antenatal screening,             Z36.9  unspecified  [redacted] weeks gestation of pregnancy                Z3A.28 ---------------------------------------------------------------------- Fetal Evaluation  Num Of Fetuses:         1  Fetal Heart Rate(bpm):  155  Cardiac Activity:       Observed  Presentation:           Cephalic  Placenta:               Posterior  P. Cord  Insertion:      Previously Visualized  Amniotic Fluid  AFI FV:      Within normal limits  AFI Sum(cm)     %Tile       Largest Pocket(cm)  10.71           17          4.26  RUQ(cm)       RLQ(cm)       LUQ(cm)        LLQ(cm)  0             2.99          4.26           3.46 ---------------------------------------------------------------------- Biometry  BPD:     73.84  mm     G. Age:  29w 4d         80  %    CI:        78.97   %    70 - 86  FL/HC:      20.0   %    18.8 - 20.6  HC:    262.73   mm     G. Age:  28w 4d         27  %    HC/AC:      1.01        1.05 - 1.21  AC:      260.6  mm     G. Age:  30w 1d         91  %    FL/BPD:     71.3   %    71 - 87  FL:      52.66  mm     G. Age:  28w 0d         27  %    FL/AC:      20.2   %    20 - 24  HUM:      51.9  mm     G. Age:  30w 2d         87  %  Est. FW:    1370  gm           3 lb     76  % ---------------------------------------------------------------------- OB History  Gravidity:    1         Term:   0        Prem:   0        SAB:   0  TOP:          0       Ectopic:  0        Living: 0 ---------------------------------------------------------------------- Gestational Age  LMP:           28w 2d        Date:  07/31/21                 EDD:   05/07/22  U/S Today:     29w 1d                                        EDD:   05/01/22  Best:          28w 2d     Det. By:  LMP  (07/31/21)          EDD:   05/07/22 ---------------------------------------------------------------------- Anatomy  Cranium:               Appears normal         LVOT:                   Appears normal  Cavum:                 Previously seen        Aortic Arch:            Previously seen  Ventricles:            Appears normal         Ductal Arch:            Not well visualized  Choroid Plexus:        Previously seen        Diaphragm:              Appears normal  Cerebellum:  Previously seen        Stomach:                Appears normal, left                                                                         sided  Posterior Fossa:       Previously seen        Abdomen:                Previously seen  Nuchal Fold:           Not applicable (>20    Abdominal Wall:         Previously seen                         wks GA)  Face:                  Orbits and profile     Cord Vessels:           Previously seen                         previously seen  Lips:                  Previously seen        Kidneys:                Appear normal  Palate:                Not well visualized    Bladder:                Appears normal  Thoracic:              Appears normal         Spine:                  Previously seen  Heart:                 Appears normal; EIF    Upper Extremities:      Previously seen  RVOT:                  Appears normal         Lower Extremities:      Previously seen  Other:  VC, 3VV and 3VTV visualized prev. Fetus appears to be a female.          Lenses prev visualized. Technically difficult due to maternal habitus          and fetal position. ---------------------------------------------------------------------- Cervix Uterus Adnexa  Cervix  Not visualized (advanced GA >24wks)  Uterus  No abnormality visualized.  Right Ovary  No adnexal mass visualized.  Left Ovary  No adnexal mass visualized.  Cul De Sac  No free fluid seen.  Adnexa  No abnormality visualized. ---------------------------------------------------------------------- Impression  Follow up growth due to chronic hypertension and elevated  BMI  Normal interval growth with measurements consistent with  dates  Good fetal movement and amniotic fluid volume ---------------------------------------------------------------------- Recommendations  Follow up growth and BPP in 4 weeks. ----------------------------------------------------------------------  Sander Nephew, MD Electronically Signed Final Report   02/15/2022 10:36 am  ----------------------------------------------------------------------   Assessment and Plan:  Pregnancy: G1P0000 at [redacted]w[redacted]d 1. Chronic hypertension during pregnancy, antepartum Stable BP on Labetalol.  Continue weekly antenatal testing and scans as per MFM.  Preeclampsia precautions reviewed.  2. [redacted] weeks gestation of pregnancy 3. Supervision of high risk pregnancy, antepartum Preterm labor symptoms and general obstetric precautions including but not limited to vaginal bleeding, contractions, leaking of fluid and fetal movement were reviewed in detail with the patient. Please refer to After Visit Summary for other counseling recommendations.   Return in about 2 weeks (around 03/26/2022) for OFFICE OB VISIT (MD or APP).  Future Appointments  Date Time Provider [place]  03/15/2022  3:15 PM [facility] NURSE [facility]  03/15/2022  3:30 PM [facility] US2 [facility]  03/22/2022  3:15 PM [facility] NURSE [facility]  03/22/2022  3:30 PM [facility] US3 [facility]  03/26/2022  3:50 PM Kammie Scioli, Sallyanne Havers, MD [facility]  03/29/2022  3:15 PM [facility] NURSE [facility]  03/29/2022  3:30 PM [facility] US2 [facility]  04/10/2022  3:50 PM Donnamae Jude, MD [facility]  04/17/2022  4:10 PM Donnamae Jude, MD [facility]  04/24/2022  4:10 PM Donnamae Jude, MD [facility]  05/01/2022  4:10 PM Donnamae Jude, MD [facility]  05/07/2022  3:30 PM Constant, Vickii Chafe, MD [facility]    Verita Schneiders, MD

## 2022-03-15 ENCOUNTER — Ambulatory Visit: Payer: Commercial Managed Care - HMO | Admitting: *Deleted

## 2022-03-15 ENCOUNTER — Ambulatory Visit: Payer: Commercial Managed Care - HMO | Attending: Obstetrics and Gynecology

## 2022-03-15 ENCOUNTER — Encounter: Payer: Self-pay | Admitting: *Deleted

## 2022-03-15 VITALS — BP 139/77 | HR 75

## 2022-03-15 DIAGNOSIS — O10913 Unspecified pre-existing hypertension complicating pregnancy, third trimester: Secondary | ICD-10-CM

## 2022-03-15 DIAGNOSIS — O99213 Obesity complicating pregnancy, third trimester: Secondary | ICD-10-CM

## 2022-03-15 DIAGNOSIS — O35BXX0 Maternal care for other (suspected) fetal abnormality and damage, fetal cardiac anomalies, not applicable or unspecified: Secondary | ICD-10-CM

## 2022-03-15 DIAGNOSIS — O10013 Pre-existing essential hypertension complicating pregnancy, third trimester: Secondary | ICD-10-CM

## 2022-03-15 DIAGNOSIS — O10919 Unspecified pre-existing hypertension complicating pregnancy, unspecified trimester: Secondary | ICD-10-CM | POA: Diagnosis present

## 2022-03-15 DIAGNOSIS — E669 Obesity, unspecified: Secondary | ICD-10-CM | POA: Diagnosis not present

## 2022-03-15 DIAGNOSIS — Z3A32 32 weeks gestation of pregnancy: Secondary | ICD-10-CM

## 2022-03-18 ENCOUNTER — Other Ambulatory Visit: Payer: Self-pay | Admitting: *Deleted

## 2022-03-18 DIAGNOSIS — O10913 Unspecified pre-existing hypertension complicating pregnancy, third trimester: Secondary | ICD-10-CM

## 2022-03-22 ENCOUNTER — Ambulatory Visit: Payer: Medicaid Other | Attending: Family Medicine

## 2022-03-22 ENCOUNTER — Ambulatory Visit: Payer: Medicaid Other | Admitting: *Deleted

## 2022-03-22 VITALS — BP 156/90 | HR 101

## 2022-03-22 DIAGNOSIS — O10013 Pre-existing essential hypertension complicating pregnancy, third trimester: Secondary | ICD-10-CM

## 2022-03-22 DIAGNOSIS — O99213 Obesity complicating pregnancy, third trimester: Secondary | ICD-10-CM | POA: Insufficient documentation

## 2022-03-22 DIAGNOSIS — O10919 Unspecified pre-existing hypertension complicating pregnancy, unspecified trimester: Secondary | ICD-10-CM

## 2022-03-22 DIAGNOSIS — O35BXX0 Maternal care for other (suspected) fetal abnormality and damage, fetal cardiac anomalies, not applicable or unspecified: Secondary | ICD-10-CM

## 2022-03-22 DIAGNOSIS — Z3A33 33 weeks gestation of pregnancy: Secondary | ICD-10-CM

## 2022-03-22 DIAGNOSIS — O10913 Unspecified pre-existing hypertension complicating pregnancy, third trimester: Secondary | ICD-10-CM | POA: Insufficient documentation

## 2022-03-22 DIAGNOSIS — E669 Obesity, unspecified: Secondary | ICD-10-CM

## 2022-03-26 ENCOUNTER — Ambulatory Visit (INDEPENDENT_AMBULATORY_CARE_PROVIDER_SITE_OTHER): Payer: Medicaid Other | Admitting: Obstetrics & Gynecology

## 2022-03-26 ENCOUNTER — Encounter: Payer: Self-pay | Admitting: Obstetrics & Gynecology

## 2022-03-26 ENCOUNTER — Other Ambulatory Visit: Payer: Self-pay | Admitting: *Deleted

## 2022-03-26 VITALS — BP 145/89 | HR 85 | Wt 243.0 lb

## 2022-03-26 DIAGNOSIS — O10919 Unspecified pre-existing hypertension complicating pregnancy, unspecified trimester: Secondary | ICD-10-CM

## 2022-03-26 DIAGNOSIS — Z5941 Food insecurity: Secondary | ICD-10-CM

## 2022-03-26 DIAGNOSIS — O219 Vomiting of pregnancy, unspecified: Secondary | ICD-10-CM

## 2022-03-26 DIAGNOSIS — O10913 Unspecified pre-existing hypertension complicating pregnancy, third trimester: Secondary | ICD-10-CM

## 2022-03-26 DIAGNOSIS — O0993 Supervision of high risk pregnancy, unspecified, third trimester: Secondary | ICD-10-CM

## 2022-03-26 DIAGNOSIS — Z3A32 32 weeks gestation of pregnancy: Secondary | ICD-10-CM

## 2022-03-26 DIAGNOSIS — O26893 Other specified pregnancy related conditions, third trimester: Secondary | ICD-10-CM

## 2022-03-26 DIAGNOSIS — O0990 Supervision of high risk pregnancy, unspecified, unspecified trimester: Secondary | ICD-10-CM

## 2022-03-26 DIAGNOSIS — R519 Headache, unspecified: Secondary | ICD-10-CM

## 2022-03-26 DIAGNOSIS — Z3A34 34 weeks gestation of pregnancy: Secondary | ICD-10-CM

## 2022-03-26 MED ORDER — PROMETHAZINE HCL 25 MG PO TABS: 25.0000 mg | ORAL_TABLET | Freq: Four times a day (QID) | ORAL | 2 refills | Status: DC | PRN

## 2022-03-26 MED ORDER — LABETALOL HCL 200 MG PO TABS: 400.0000 mg | ORAL_TABLET | Freq: Three times a day (TID) | ORAL | 0 refills | Status: DC

## 2022-03-26 NOTE — Patient Instructions (Signed)
Return to office for any scheduled appointments. Call the office or go to the MAU at [facility] if: You begin to have strong, frequent contractions Your water breaks.  Sometimes it is a big gush of fluid, sometimes it is just a trickle that keeps getting your underwear wet or running down your legs You have vaginal bleeding.  It is normal to have a small amount of spotting if your cervix was checked.  You do not feel your baby moving like normal.  If you do not, get something to eat and drink and lay down and focus on feeling your baby move.   If your baby is still not moving like normal, you should call the office or go to MAU. Any other obstetric concerns.  

## 2022-03-26 NOTE — Progress Notes (Signed)
PRENATAL VISIT NOTE  Subjective:  Carmen Reid is a 26 y.o. G1P0000 at [redacted]w[redacted]d being seen today for ongoing prenatal care.  She is currently monitored for the following issues for this high-risk pregnancy and has PCOS (polycystic ovarian syndrome); Anxiety and depression; HLD (hyperlipidemia); Pseudotumor cerebri; Bilateral lower extremity edema; Supervision of high risk pregnancy, antepartum; Maternal morbid obesity, antepartum (HCC); Chronic hypertension during pregnancy, antepartum; BMI 40.0-44.9, adult (HCC); and Nausea and vomiting during pregnancy on their problem list.  Patient reports headaches every other night, has been having intermittent headaches for the last few months. Already prescribed magnesium oxide and Flexeril, she takes these sometimes. Also has nausea and had episode of vomiting this morning, already has prescription for Zofran and Protonix.  Today, patient denies any headaches, visual symptoms, RUQ/epigastric pain or other concerning symptoms.  Contractions: Not present. Vag. Bleeding: None.  Movement: Present. Denies leaking of fluid.   The following portions of the patient's history were reviewed and updated as appropriate: allergies, current medications, past family history, past medical history, past social history, past surgical history and problem list.   Objective:   Vitals:   03/26/22 1557  BP: (!) 145/89  Pulse: 85  Weight: 243 lb (110.2 kg)    Fetal Status: Fetal Heart Rate (bpm): 148   Movement: Present     General:  Alert, oriented and cooperative. Patient is in no acute distress.  Skin: Skin is warm and dry. No rash noted.   Cardiovascular: Normal heart rate noted  Respiratory: Normal respiratory effort, no problems with respiration noted  Abdomen: Soft, gravid, appropriate for gestational age.  Pain/Pressure: Present     Pelvic: Cervical exam deferred        Extremities: Normal range of motion.  Edema: Mild pitting, slight indentation   Mental Status: Normal mood and affect. Normal behavior. Normal judgment and thought content.   [place] MFM FETAL BPP WO NON STRESS  Result Date: 03/22/2022 ----------------------------------------------------------------------  OBSTETRICS REPORT                       (Signed Final 03/22/2022 04:45 pm) ---------------------------------------------------------------------- Patient Info  ID #:       [id number]                          D.O.B.:  06/04/1996 (26 yrs)  Name:       Carmen Reid                Visit Date: 03/22/2022 03:12 pm              DAWN Vannest ---------------------------------------------------------------------- Performed By  Attending:        Ma Rings MD         Ref. Address:     [address]                                                             [address]  Performed By:     Anabel Halon          [facility]  [facility] ---------------------------------------------------------------------- Orders  #  Description                           Code        Ordered By  1  US MFM FETAL BPP WO NON               [id number]    CORENTHIAN     STRESS                                            BOOKER ----------------------------------------------------------------------  #  Order #                     Accession #                Episode #  1  [id number]                   [id number]                 [id number] ---------------------------------------------------------------------- Indications  Hypertension - Chronic/Pre-existing            O10.019  (labetalol)  Obesity complicating pregnancy, third          O99.213  trimester (BMI 43)  [redacted] weeks gestation of pregnancy                Z3A.33  Fetal abnormality - other known or             O35.9XX0  suspected  (EIF)  LR NIPS/Neg AFP/ Neg Horizon  Encounter for other antenatal screening        Z36.2  follow-up ---------------------------------------------------------------------- Vital Signs  BP:          156/90 ---------------------------------------------------------------------- Fetal Evaluation  Num Of Fetuses:         1  Fetal Heart Rate(bpm):  129  Cardiac Activity:       Observed  Presentation:           Cephalic  Placenta:               Posterior  P. Cord Insertion:      Previously Visualized  Amniotic Fluid  AFI FV:      Within normal limits  AFI Sum(cm)     %Tile       Largest Pocket(cm)  8.11            4.8         3.44  RUQ(cm)       RLQ(cm)  LUQ(cm)        LLQ(cm)  1.92          2.75          0              3.44 ---------------------------------------------------------------------- Biophysical Evaluation  Amniotic F.V:   Pocket => 2 cm             F. Tone:        Observed  F. Movement:    Observed                   Score:          8/8  F. Breathing:   Observed ---------------------------------------------------------------------- OB History  Gravidity:    1         Term:   0        Prem:   0        SAB:   0  TOP:          0       Ectopic:  0        Living: 0 ---------------------------------------------------------------------- Gestational Age  LMP:           33w 3d        Date:  07/31/21                 EDD:   05/07/22  Best:          33w 3d     Det. By:  LMP  (07/31/21)          EDD:   05/07/22 ---------------------------------------------------------------------- Anatomy  Cranium:               Previously seen        LVOT:                   Previously seen  Cavum:                 Previously seen        Aortic Arch:            Previously seen  Ventricles:            Previously seen        Ductal Arch:            Not well visualized  Choroid Plexus:        Previously seen        Diaphragm:              Previously seen  Cerebellum:            Previously seen        Stomach:                Appears normal,  left                                                                        sided  Posterior Fossa:       Previously seen        Abdomen:                Previously seen  Nuchal Fold:  Not applicable (>20    Abdominal Wall:         Previously seen                         wks GA)  Face:                  Orbits and profile     Cord Vessels:           Previously seen                         previously seen  Lips:                  Previously seen        Kidneys:                Appear normal  Palate:                Not well visualized    Bladder:                Appears normal  Thoracic:              Previously seen        Spine:                  Previously seen  Heart:                 Previously seen; EIF   Upper Extremities:      Previously seen  RVOT:                  Previously seen        Lower Extremities:      Previously seen  Other:  Lenses, VC, 3VV and 3VTV visualized prev. Female gender previously          seen. Technically difficult due to maternal habitus and fetal position. ---------------------------------------------------------------------- Cervix Uterus Adnexa  Cervix  Not visualized (advanced GA >24wks)  Uterus  No abnormality visualized.  Right Ovary  Within normal limits.  Left Ovary  Within normal limits.  Cul De Sac  No free fluid seen.  Adnexa  No abnormality visualized. ---------------------------------------------------------------------- Comments  This patient was seen for a BPP due to chronic hypertension  treated with labetalol 200 mg 3 times a day and maternal  obesity with a BMI of 43.  She denies any problems since her  last exam.  Her blood pressures today were 149/88 and  156/90.  A biophysical profile performed today was 8 out of 8.  There was normal amniotic fluid noted on today's ultrasound  exam.  She will return in 1 week for another BPP.  The patient was advised to continue to watch her blood  pressures.  Her labetalol dosage may need to be increased  should her blood pressures  be persistently greater than 140  over 90s. ----------------------------------------------------------------------                  Ma Rings, MD Electronically Signed Final Report   03/22/2022 04:45 pm ----------------------------------------------------------------------  [place] MFM FETAL BPP WO NON STRESS  Result Date: 03/15/2022 ----------------------------------------------------------------------  OBSTETRICS REPORT                       (Signed Final 03/15/2022 04:09 pm) ---------------------------------------------------------------------- Patient Info  ID #:       [id number]  D.O.B.:  11-Apr-1996 (26 yrs)  Name:       KORINNA TAT                Visit Date: 03/15/2022 03:53 pm              DAWN Lung ---------------------------------------------------------------------- Performed By  Attending:        Noralee Space MD        Ref. Address:     [address]                                                             [address]  Performed By:     Joanna Hews         [facility]                                     [facility]  Referred By:      [facility] ---------------------------------------------------------------------- Orders  #  Description                           Code        Ordered By  1  [place] MFM FETAL BPP WO NON               76819.01    CORENTHIAN     STRESS                                            BOOKER  2  [place] MFM OB FOLLOW UP                   60737.10    Lin Landsman ----------------------------------------------------------------------  #  Order #                     Accession #  Episode #  1  [id number]                   [id number]                 [id number]  2  [id number]                   [id number]                  [id number] ---------------------------------------------------------------------- Indications  Hypertension - Chronic/Pre-existing            O10.019  (labetalol)  [redacted] weeks gestation of pregnancy                Z3A.32  Obesity complicating pregnancy, second         O99.212  trimester (BMI 43)  Fetal abnormality - other known or             O35.9XX0  suspected (EIF)  LR NIPS/Neg AFP/ Neg Horizon  Encounter for antenatal screening,             Z36.9  unspecified ---------------------------------------------------------------------- Vital Signs  BP:          139/77 ---------------------------------------------------------------------- Fetal Evaluation  Num Of Fetuses:         1  Fetal Heart Rate(bpm):  137  Cardiac Activity:       Observed  Presentation:           Cephalic  Placenta:               Posterior  P. Cord Insertion:      Previously Visualized  Amniotic Fluid  AFI FV:      Within normal limits  AFI Sum(cm)     %Tile       Largest Pocket(cm)  8.89            8           3.65  RUQ(cm)       RLQ(cm)       LUQ(cm)        LLQ(cm)  2.21          3.1           2.27           1.31 ---------------------------------------------------------------------- Biophysical Evaluation  Amniotic F.V:   Pocket => 2 cm             F. Tone:        Observed  F. Movement:    Observed                   Score:          8/8  F. Breathing:   Observed ---------------------------------------------------------------------- Biometry  BPD:     83.39  mm     G. Age:  33w 4d         75  %    CI:        81.31   %    70 - 86                                                          FL/HC:      20.1   %    19.1 - 21.3  HC:    291.94  mm     G. Age:  32w 1d         11  %    HC/AC:      1.06        0.96 - 1.17  AC:      275.4  mm     G. Age:  31w 4d         26  %    FL/BPD:     70.5   %    71 - 87  FL:       58.8  mm     G. Age:  30w 5d          5  %    FL/AC:      21.4   %    20 - 24  Est. FW:    1788  gm    3 lb 15 oz      16  %  ---------------------------------------------------------------------- OB History  Gravidity:    1         Term:   0        Prem:   0        SAB:   0  TOP:          0       Ectopic:  0        Living: 0 ---------------------------------------------------------------------- Gestational Age  LMP:           32w 3d        Date:  07/31/21                 EDD:   05/07/22  U/S Today:     32w 0d                                        EDD:   05/10/22  Best:          32w 3d     Det. By:  LMP  (07/31/21)          EDD:   05/07/22 ---------------------------------------------------------------------- Anatomy  Cranium:               Appears normal         Stomach:                Appears normal, left                                                                        sided  Cavum:                 Appears normal         Abdomen:                Appears normal  Heart:                 Appears normal         Kidneys:                Appear normal                         (  4CH, axis, and                         situs)  Diaphragm:             Appears normal         Bladder:                Appears normal ---------------------------------------------------------------------- Cervix Uterus Adnexa  Cervix  Normal appearance by transabdominal scan.  Uterus  No abnormality visualized.  Right Ovary  Size(cm)     3.63   x   2.46   x  2.28      Vol(ml): 10.66  Not visualized.  Left Ovary  Size(cm)     3.21   x   2.42   x  2.74      Vol(ml): 11.14  Not visualized. ---------------------------------------------------------------------- Impression  Chronic hypertension.  Well-controlled on labetalol.  Fetal growth is appropriate for gestational age.  Amniotic fluid  is normal and good fetal activity seen.  Antenatal testing is  reassuring.  Cephalic presentation. ---------------------------------------------------------------------- Recommendations  -Continue weekly BPP till delivery.  ----------------------------------------------------------------------                 Noralee Space, MD Electronically Signed Final Report   03/15/2022 04:09 pm ----------------------------------------------------------------------  [place] MFM OB FOLLOW UP  Result Date: 03/15/2022 ----------------------------------------------------------------------  OBSTETRICS REPORT                       (Signed Final 03/15/2022 04:09 pm) ---------------------------------------------------------------------- Patient Info  ID #:       [id number]                          D.O.B.:  [birth year] (26 yrs)  Name:       Carmen Reid                Visit Date: 03/15/2022 03:53 pm              DAWN Lamantia ---------------------------------------------------------------------- Performed By  Attending:        Noralee Space MD        Ref. Address:     [address]                                                             [address]  Performed By:     Joanna Hews         [facility]                                     [facility]  Referred By:      [facility] ---------------------------------------------------------------------- Orders  #  Description                           Code        Ordered By  1  [place] MFM FETAL BPP WO NON               76819.01    CORENTHIAN     STRESS                                            BOOKER  2  [place] MFM OB FOLLOW UP                   76816.01    Lin Landsman ----------------------------------------------------------------------  #  Order #                     Accession #                Episode #  1  [id number]                   [id number]                 [id number]  2  [id number]                   [id number]                 [id number]  ---------------------------------------------------------------------- Indications  Hypertension - Chronic/Pre-existing            O10.019  (labetalol)  [redacted] weeks gestation of pregnancy                Z3A.32  Obesity complicating pregnancy, second         O99.212  trimester (BMI 43)  Fetal abnormality - other known or             O35.9XX0  suspected (EIF)  LR NIPS/Neg AFP/ Neg Horizon  Encounter for antenatal screening,             Z36.9  unspecified ---------------------------------------------------------------------- Vital Signs  BP:          139/77 ---------------------------------------------------------------------- Fetal Evaluation  Num Of Fetuses:         1  Fetal Heart Rate(bpm):  137  Cardiac Activity:       Observed  Presentation:           Cephalic  Placenta:               Posterior  P. Cord Insertion:      Previously Visualized  Amniotic Fluid  AFI FV:      Within normal limits  AFI Sum(cm)     %Tile       Largest Pocket(cm)  8.89            8           3.65  RUQ(cm)  RLQ(cm)       LUQ(cm)        LLQ(cm)  2.21          3.1           2.27           1.31 ---------------------------------------------------------------------- Biophysical Evaluation  Amniotic F.V:   Pocket => 2 cm             F. Tone:        Observed  F. Movement:    Observed                   Score:          8/8  F. Breathing:   Observed ---------------------------------------------------------------------- Biometry  BPD:     83.39  mm     G. Age:  33w 4d         75  %    CI:        81.31   %    70 - 86                                                          FL/HC:      20.1   %    19.1 - 21.3  HC:    291.94   mm     G. Age:  32w 1d         11  %    HC/AC:      1.06        0.96 - 1.17  AC:      275.4  mm     G. Age:  31w 4d         26  %    FL/BPD:     70.5   %    71 - 87  FL:       58.8  mm     G. Age:  30w 5d          5  %    FL/AC:      21.4   %    20 - 24  Est. FW:    1788  gm    3 lb 15 oz      16  %  ---------------------------------------------------------------------- OB History  Gravidity:    1         Term:   0        Prem:   0        SAB:   0  TOP:          0       Ectopic:  0        Living: 0 ---------------------------------------------------------------------- Gestational Age  LMP:           32w 3d        Date:  07/31/21                 EDD:   05/07/22  U/S Today:     32w 0d                                        EDD:   05/10/22  Best:  32w 3d     Det. By:  LMP  (07/31/21)          EDD:   05/07/22 ---------------------------------------------------------------------- Anatomy  Cranium:               Appears normal         Stomach:                Appears normal, left                                                                        sided  Cavum:                 Appears normal         Abdomen:                Appears normal  Heart:                 Appears normal         Kidneys:                Appear normal                         (4CH, axis, and                         situs)  Diaphragm:             Appears normal         Bladder:                Appears normal ---------------------------------------------------------------------- Cervix Uterus Adnexa  Cervix  Normal appearance by transabdominal scan.  Uterus  No abnormality visualized.  Right Ovary  Size(cm)     3.63   x   2.46   x  2.28      Vol(ml): 10.66  Not visualized.  Left Ovary  Size(cm)     3.21   x   2.42   x  2.74      Vol(ml): 11.14  Not visualized. ---------------------------------------------------------------------- Impression  Chronic hypertension.  Well-controlled on labetalol.  Fetal growth is appropriate for gestational age.  Amniotic fluid  is normal and good fetal activity seen.  Antenatal testing is  reassuring.  Cephalic presentation. ---------------------------------------------------------------------- Recommendations  -Continue weekly BPP till delivery.  ----------------------------------------------------------------------                 Noralee Space, MD Electronically Signed Final Report   03/15/2022 04:09 pm ----------------------------------------------------------------------   Assessment and Plan:  Pregnancy: G1P0000 at [redacted]w[redacted]d 1. Chronic hypertension during pregnancy, antepartum Increased Labetalol to 400 mg po tid.  Will check labs today, will follow up results and manage accordingly. Already doing antenatal testing and scans with MFM. Will follow up recommendations.  - Comprehensive metabolic panel - CBC - Protein / creatinine ratio, urine - labetalol (NORMODYNE) 200 MG tablet; Take 2 tablets (400 mg total) by mouth 3 (three) times daily.  Dispense: 180 tablet; Refill: 0  2. Headache in pregnancy, antepartum, third trimester Phenergan prescribed to help with headaches, with her other medications. If they are severe, this could be concerning as a severe feature.  Will continue to monitor closely. - promethazine (PHENERGAN) 25 MG tablet; Take 1 tablet (25 mg total) by mouth every 6 (six) hours as needed for nausea or vomiting (headache).  Dispense: 30 tablet; Refill: 2  3. Nausea and vomiting during pregnancy Phenergan also prescribed. Encouraged to continue Protonix and Zofran as needed. - promethazine (PHENERGAN) 25 MG tablet; Take 1 tablet (25 mg total) by mouth every 6 (six) hours as needed for nausea or vomiting (headache).  Dispense: 30 tablet; Refill: 2  4. [redacted] weeks gestation of pregnancy 5. Supervision of high risk pregnancy, antepartum No other concerns. Pelvic cultures next visit. Preterm labor symptoms and general obstetric precautions including but not limited to vaginal bleeding, contractions, leaking of fluid and fetal movement were reviewed in detail with the patient. Please refer to After Visit Summary for other counseling recommendations.   Return in about 2 weeks (around 04/09/2022) for Pelvic cultures, OFFICE OB VISIT  (MD only).  Future Appointments  Date Time Provider Department Center  03/29/2022  3:15 PM [facility] NURSE [facility]  03/29/2022  3:30 PM [facility] US2 [facility]  04/05/2022  2:15 PM [facility] NURSE [facility]  04/05/2022  2:30 PM [facility] US3 [facility]  04/10/2022  3:50 PM Reva Bores, MD [facility]  04/12/2022  1:30 PM [facility] NURSE [facility]  04/12/2022  1:45 PM [facility] US4 [facility]  04/17/2022  4:10 PM Reva Bores, MD [facility]  04/24/2022  4:10 PM Reva Bores, MD [facility]  05/01/2022  4:10 PM Reva Bores, MD [facility]  05/07/2022  3:30 PM Constant, Gigi Gin, MD [facility]    Jaynie Collins, MD

## 2022-03-27 LAB — COMPREHENSIVE METABOLIC PANEL
ALT: 6 IU/L (ref 0–32)
AST: 11 IU/L (ref 0–40)
Albumin/Globulin Ratio: 1.4 (ref 1.2–2.2)
Albumin: 3.8 g/dL — ABNORMAL LOW (ref 4.0–5.0)
Alkaline Phosphatase: 103 IU/L (ref 44–121)
BUN/Creatinine Ratio: 13 (ref 9–23)
BUN: 7 mg/dL (ref 6–20)
Bilirubin Total: <0.2 mg/dL (ref 0.0–1.2)
CO2: 18 mmol/L — ABNORMAL LOW (ref 20–29)
Calcium: 9.3 mg/dL (ref 8.7–10.2)
Chloride: 101 mmol/L (ref 96–106)
Creatinine, Ser: 0.56 mg/dL — ABNORMAL LOW (ref 0.57–1.00)
Globulin, Total: 2.8 g/dL (ref 1.5–4.5)
Glucose: 90 mg/dL (ref 70–99)
Potassium: 4.3 mmol/L (ref 3.5–5.2)
Sodium: 137 mmol/L (ref 134–144)
Total Protein: 6.6 g/dL (ref 6.0–8.5)
eGFR: 129 mL/min/{1.73_m2} (ref >59)

## 2022-03-27 LAB — CBC
Hematocrit: 37.2 % (ref 34.0–46.6)
Hemoglobin: 12.6 g/dL (ref 11.1–15.9)
MCH: 27.9 pg (ref 26.6–33.0)
MCHC: 33.9 g/dL (ref 31.5–35.7)
MCV: 83 fL (ref 79–97)
Platelets: 317 10*3/uL (ref 150–450)
RBC: 4.51 x10E6/uL (ref 3.77–5.28)
RDW: 13.9 % (ref 11.7–15.4)
WBC: 11.2 10*3/uL — ABNORMAL HIGH (ref 3.4–10.8)

## 2022-03-27 LAB — PROTEIN / CREATININE RATIO, URINE
Creatinine, Urine: 130 mg/dL
Protein, Ur: 12 mg/dL
Protein/Creat Ratio: 92 mg/g creat (ref 0–200)

## 2022-03-29 ENCOUNTER — Other Ambulatory Visit: Payer: Commercial Managed Care - HMO

## 2022-03-29 ENCOUNTER — Ambulatory Visit: Payer: Medicaid Other

## 2022-04-04 ENCOUNTER — Inpatient Hospital Stay (HOSPITAL_COMMUNITY)
Admission: AD | Admit: 2022-04-04 | Discharge: 2022-04-07 | DRG: 806 | Disposition: A | Discharge status: Home / Self Care | Payer: Medicaid Other | Attending: Obstetrics and Gynecology | Admitting: Obstetrics and Gynecology

## 2022-04-04 ENCOUNTER — Encounter (HOSPITAL_COMMUNITY): Payer: Self-pay | Admitting: Obstetrics & Gynecology

## 2022-04-04 ENCOUNTER — Inpatient Hospital Stay (HOSPITAL_COMMUNITY): Payer: Medicaid Other | Admitting: Anesthesiology

## 2022-04-04 ENCOUNTER — Other Ambulatory Visit: Payer: Self-pay

## 2022-04-04 DIAGNOSIS — Z91040 Latex allergy status: Secondary | ICD-10-CM

## 2022-04-04 DIAGNOSIS — O99214 Obesity complicating childbirth: Secondary | ICD-10-CM | POA: Diagnosis present

## 2022-04-04 DIAGNOSIS — O4202 Full-term premature rupture of membranes, onset of labor within 24 hours of rupture: Secondary | ICD-10-CM | POA: Diagnosis not present

## 2022-04-04 DIAGNOSIS — Z3A35 35 weeks gestation of pregnancy: Secondary | ICD-10-CM

## 2022-04-04 DIAGNOSIS — O99284 Endocrine, nutritional and metabolic diseases complicating childbirth: Secondary | ICD-10-CM | POA: Diagnosis present

## 2022-04-04 DIAGNOSIS — E282 Polycystic ovarian syndrome: Secondary | ICD-10-CM | POA: Diagnosis present

## 2022-04-04 DIAGNOSIS — O1002 Pre-existing essential hypertension complicating childbirth: Secondary | ICD-10-CM | POA: Diagnosis present

## 2022-04-04 DIAGNOSIS — O42019 Preterm premature rupture of membranes, onset of labor within 24 hours of rupture, unspecified trimester: Secondary | ICD-10-CM | POA: Diagnosis present

## 2022-04-04 DIAGNOSIS — O10919 Unspecified pre-existing hypertension complicating pregnancy, unspecified trimester: Principal | ICD-10-CM

## 2022-04-04 DIAGNOSIS — Z30430 Encounter for insertion of intrauterine contraceptive device: Secondary | ICD-10-CM | POA: Diagnosis not present

## 2022-04-04 DIAGNOSIS — O42013 Preterm premature rupture of membranes, onset of labor within 24 hours of rupture, third trimester: Secondary | ICD-10-CM | POA: Diagnosis present

## 2022-04-04 DIAGNOSIS — O26893 Other specified pregnancy related conditions, third trimester: Secondary | ICD-10-CM | POA: Diagnosis present

## 2022-04-04 LAB — COMPREHENSIVE METABOLIC PANEL
ALT: 8 U/L (ref 0–44)
AST: 13 U/L — ABNORMAL LOW (ref 15–41)
Albumin: 2.7 g/dL — ABNORMAL LOW (ref 3.5–5.0)
Alkaline Phosphatase: 82 U/L (ref 38–126)
Anion gap: 12 (ref 5–15)
BUN: 9 mg/dL (ref 6–20)
CO2: 16 mmol/L — ABNORMAL LOW (ref 22–32)
Calcium: 8.9 mg/dL (ref 8.9–10.3)
Chloride: 107 mmol/L (ref 98–111)
Creatinine, Ser: 0.61 mg/dL (ref 0.44–1.00)
GFR, Estimated: >60 mL/min (ref >60)
Glucose, Bld: 91 mg/dL (ref 70–99)
Potassium: 4 mmol/L (ref 3.5–5.1)
Sodium: 135 mmol/L (ref 135–145)
Total Bilirubin: 0.2 mg/dL — ABNORMAL LOW (ref 0.3–1.2)
Total Protein: 5.9 g/dL — ABNORMAL LOW (ref 6.5–8.1)

## 2022-04-04 LAB — CBC
HCT: 37.8 % (ref 36.0–46.0)
HCT: 35.4 % — ABNORMAL LOW (ref 36.0–46.0)
Hemoglobin: 12.3 g/dL (ref 12.0–15.0)
Hemoglobin: 11.9 g/dL — ABNORMAL LOW (ref 12.0–15.0)
MCH: 27.9 pg (ref 26.0–34.0)
MCH: 28.5 pg (ref 26.0–34.0)
MCHC: 32.5 g/dL (ref 30.0–36.0)
MCHC: 33.6 g/dL (ref 30.0–36.0)
MCV: 85.7 fL (ref 80.0–100.0)
MCV: 84.9 fL (ref 80.0–100.0)
Platelets: 284 10*3/uL (ref 150–400)
Platelets: 283 10*3/uL (ref 150–400)
RBC: 4.41 MIL/uL (ref 3.87–5.11)
RBC: 4.17 MIL/uL (ref 3.87–5.11)
RDW: 14.6 % (ref 11.5–15.5)
RDW: 14.6 % (ref 11.5–15.5)
WBC: 12.5 10*3/uL — ABNORMAL HIGH (ref 4.0–10.5)
WBC: 14.9 10*3/uL — ABNORMAL HIGH (ref 4.0–10.5)
nRBC: 0 % (ref 0.0–0.2)
nRBC: 0 % (ref 0.0–0.2)

## 2022-04-04 LAB — GROUP B STREP BY PCR: Group B strep by PCR: NEGATIVE

## 2022-04-04 LAB — TYPE AND SCREEN
ABO/RH(D): O POS
Antibody Screen: NEGATIVE

## 2022-04-04 LAB — PROTEIN / CREATININE RATIO, URINE
Creatinine, Urine: 235 mg/dL
Protein Creatinine Ratio: 0.06 mg/mg{Cre} (ref 0.00–0.15)
Total Protein, Urine: 14 mg/dL

## 2022-04-04 MED ORDER — LIDOCAINE HCL (PF) 1 % IJ SOLN: 30.0000 mL | INTRAMUSCULAR | Status: DC | PRN

## 2022-04-04 MED ORDER — OXYTOCIN BOLUS FROM INFUSION
333.0000 mL | Freq: Once | INTRAVENOUS | Status: AC
  Administered 2022-04-05: 333 mL via INTRAVENOUS

## 2022-04-04 MED ORDER — OXYCODONE-ACETAMINOPHEN 5-325 MG PO TABS: 2.0000 | ORAL_TABLET | ORAL | Status: DC | PRN

## 2022-04-04 MED ORDER — ACETAMINOPHEN 325 MG PO TABS: 650.0000 mg | ORAL_TABLET | ORAL | Status: DC | PRN

## 2022-04-04 MED ORDER — MISOPROSTOL 50MCG HALF TABLET
50.0000 ug | ORAL_TABLET | Freq: Once | ORAL | Status: AC
Start: 2022-04-04 — End: 2022-04-04
  Administered 2022-04-04: 50 ug via BUCCAL
  Filled 2022-04-04: qty 1

## 2022-04-04 MED ORDER — PHENYLEPHRINE 80 MCG/ML (10ML) SYRINGE FOR IV PUSH (FOR BLOOD PRESSURE SUPPORT)
80.0000 ug | PREFILLED_SYRINGE | INTRAVENOUS | Status: DC | PRN
Start: 2022-04-04 — End: 2022-04-05

## 2022-04-04 MED ORDER — LABETALOL HCL 200 MG PO TABS: 400.0000 mg | ORAL_TABLET | Freq: Three times a day (TID) | ORAL | Status: DC

## 2022-04-04 MED ORDER — PENICILLIN G POT IN DEXTROSE 60000 UNIT/ML IV SOLN
3.0000 10*6.[IU] | INTRAVENOUS | Status: DC
  Administered 2022-04-04 – 2022-04-05 (×3): 3 10*6.[IU] via INTRAVENOUS
  Filled 2022-04-04 (×6): qty 50

## 2022-04-04 MED ORDER — LACTATED RINGERS IV SOLN: INTRAVENOUS | Status: DC

## 2022-04-04 MED ORDER — PHENYLEPHRINE 80 MCG/ML (10ML) SYRINGE FOR IV PUSH (FOR BLOOD PRESSURE SUPPORT): 80.0000 ug | PREFILLED_SYRINGE | INTRAVENOUS | Status: DC | PRN

## 2022-04-04 MED ORDER — OXYCODONE-ACETAMINOPHEN 5-325 MG PO TABS: 1.0000 | ORAL_TABLET | ORAL | Status: DC | PRN

## 2022-04-04 MED ORDER — LEVONORGESTREL 20 MCG/DAY IU IUD
1.0000 | INTRAUTERINE_SYSTEM | Freq: Once | INTRAUTERINE | Status: AC
  Administered 2022-04-05: 1 via INTRAUTERINE
  Filled 2022-04-04: qty 1

## 2022-04-04 MED ORDER — TERBUTALINE SULFATE 1 MG/ML IJ SOLN
0.2500 mg | Freq: Once | INTRAMUSCULAR | Status: AC
  Administered 2022-04-04: 0.25 mg via SUBCUTANEOUS

## 2022-04-04 MED ORDER — FENTANYL CITRATE (PF) 100 MCG/2ML IJ SOLN
50.0000 ug | INTRAMUSCULAR | Status: DC | PRN
  Administered 2022-04-04: 50 ug via INTRAVENOUS
  Administered 2022-04-04 (×2): 100 ug via INTRAVENOUS
  Filled 2022-04-04 (×4): qty 2

## 2022-04-04 MED ORDER — LIDOCAINE HCL (PF) 1 % IJ SOLN
INTRAMUSCULAR | Status: DC | PRN
  Administered 2022-04-04: 11 mL via EPIDURAL

## 2022-04-04 MED ORDER — FENTANYL-BUPIVACAINE-NACL 0.5-0.125-0.9 MG/250ML-% EP SOLN
12.0000 mL/h | EPIDURAL | Status: DC | PRN
  Administered 2022-04-04 – 2022-04-05 (×2): 12 mL/h via EPIDURAL
  Filled 2022-04-04 (×2): qty 250

## 2022-04-04 MED ORDER — DIPHENHYDRAMINE HCL 50 MG/ML IJ SOLN: 12.5000 mg | INTRAMUSCULAR | Status: DC | PRN

## 2022-04-04 MED ORDER — LABETALOL HCL 200 MG PO TABS
400.0000 mg | ORAL_TABLET | Freq: Three times a day (TID) | ORAL | Status: DC
  Administered 2022-04-04 – 2022-04-07 (×9): 400 mg via ORAL
  Filled 2022-04-04 (×9): qty 2

## 2022-04-04 MED ORDER — LACTATED RINGERS IV SOLN: 500.0000 mL | INTRAVENOUS | Status: DC | PRN

## 2022-04-04 MED ORDER — LACTATED RINGERS IV SOLN: 500.0000 mL | Freq: Once | INTRAVENOUS | Status: DC

## 2022-04-04 MED ORDER — EPHEDRINE 5 MG/ML INJ
10.0000 mg | INTRAVENOUS | Status: DC | PRN
Start: 2022-04-04 — End: 2022-04-05

## 2022-04-04 MED ORDER — SOD CITRATE-CITRIC ACID 500-334 MG/5ML PO SOLN: 30.0000 mL | ORAL | Status: DC | PRN

## 2022-04-04 MED ORDER — SODIUM CHLORIDE 0.9 % IV SOLN
5.0000 10*6.[IU] | Freq: Once | INTRAVENOUS | Status: AC
  Administered 2022-04-04: 5 10*6.[IU] via INTRAVENOUS
  Filled 2022-04-04: qty 5

## 2022-04-04 MED ORDER — ONDANSETRON HCL 4 MG/2ML IJ SOLN: 4.0000 mg | Freq: Four times a day (QID) | INTRAMUSCULAR | Status: DC | PRN

## 2022-04-04 MED ORDER — OXYTOCIN-SODIUM CHLORIDE 30-0.9 UT/500ML-% IV SOLN
2.5000 [IU]/h | INTRAVENOUS | Status: DC
  Administered 2022-04-05: 2.5 [IU]/h via INTRAVENOUS
  Filled 2022-04-04: qty 500

## 2022-04-04 MED ORDER — TERBUTALINE SULFATE 1 MG/ML IJ SOLN
INTRAMUSCULAR | Status: AC
  Administered 2022-04-04: 0.25 mg via SUBCUTANEOUS
  Filled 2022-04-04: qty 1

## 2022-04-04 NOTE — MAU Note (Signed)
Mirranda [address] [place] is a 26 y.o. at [redacted]w[redacted]d here in MAU reporting: she lost her mucus plug this morning and woke up in "a pool of water", @ 0200 states she thinks her water broke.  Reports fluid is clear. Denies VB.  Reports +FM, none since 0500 this morning. LMP: N/A Onset of complaint: today Pain score: 2 Vitals:   04/04/22 1102  BP: 133/88  Pulse: 85  Resp: 19  Temp: 97.7 F (36.5 C)  SpO2: 100%     FHT:136 bpm Lab orders placed from triage:   None

## 2022-04-04 NOTE — H&P (Signed)
OBSTETRIC ADMISSION HISTORY AND PHYSICAL  Milanie Markala Sitts is a 26 y.o. female G1P0000 with IUP at [redacted]w[redacted]d by LMP presenting for leakage of fluid. She reports +FMs, No LOF, no VB, no blurry vision, headaches or peripheral edema, and RUQ pain.  She plans on breast feeding. She is considering a tubal for birth control. She received her prenatal care at  [facility] [place]    She started leaking fluid at 2:00 this morning 8/17.  She reports that she had woken up and found herself in a puddle of clear fluid.  She has subsequently continued to leak clear liquid.  She has mild occasional contractions which are painless.  No vaginal bleeding, she feels baby moving.  She has had no headache, changes in her vision or right upper quadrant pain.  Dating: By LMP and 10 wk [place] --->  Estimated Date of Delivery: 05/07/22  Sono:  @[redacted]w[redacted]d , CWD, normal anatomy, cephalic presentation, longitudinal lie, 1788g, 16th% EFW   Prenatal History/Complications: Chronic hypertension, treated with labetalol 400mg  TID  Nursing Staff Provider  Office Location  [place] Dating  LMP/10wk scan  [facility] Model Traditional    Language   English Anatomy    Flu Vaccine  no Genetic/Carrier Screen  NIPS:   Low risk female AFP:   wnl Horizon: negx 4  TDaP Vaccine   Declined Hgb A1C or  GTT Early  Third trimester   COVID Vaccine No   LAB RESULTS   Rhogam   Blood Type O/Positive/-- (02/27 1609)   Baby Feeding Plan  breast Antibody Negative (02/27 1609)  Contraception Possible Tubal Rubella 1.34 (02/27 1609)  Circumcision Yes RPR Non Reactive (06/29 0859)   Pediatrician  Undecided HBsAg Negative (02/27 1609)   Support Person  FOB- Matthew HCVAb   Prenatal Classes  HIV Non Reactive (06/29 0859)     BTL Consent  GBS   (For PCN allergy, check sensitivities)   VBAC Consent  Pap Neg 2023       DME Rx [ X] BP cuff [ ]  Weight Scale Waterbirth  [ ]  Class [ ]  Consent [ ]  CNM visit  PHQ9 & GAD7 [  ] new OB [  ] 28 weeks  [  ] 36 weeks  Induction  [ ]  Orders Entered [ ] Foley Y/N    Past Medical History: Past Medical History:  Diagnosis Date   Anxiety    Depression    High cholesterol    Not on medication   Ovarian cyst    PCOS (polycystic ovarian syndrome)    Vascular disease    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    Past Surgical History: Past Surgical History:  Procedure Laterality Date   addenoidectony     TONSILLECTOMY     age 39 or 7    Obstetrical History: OB History     Gravida  1   Para  0   Term  0   Preterm  0   AB  0   Living  0      SAB  0   IAB  0   Ectopic  0   Multiple  0   Live Births              Social History Social History   Socioeconomic History   Marital status: Single    Spouse name: Not on file   Number of children: 0   Years of education: HS   Highest  education level: Not on file  Occupational History   Occupation: Designer, jewellery  Tobacco Use   Smoking status: Never   Smokeless tobacco: Never  Vaping Use   Vaping Use: Never used  Substance and Sexual Activity   Alcohol use: Not Currently    Comment: not while preg   Drug use: No   Sexual activity: Yes    Birth control/protection: None  Other Topics Concern   Not on file  Social History Narrative   Occasional caffeine use    Social Determinants of Corporate investment banker Strain: Not on file  Food Insecurity: Not on file  Transportation Needs: Not on file  Physical Activity: Not on file  Stress: Not on file  Social Connections: Not on file    Family History: Family History  Problem Relation Age of Onset   Cancer Maternal Grandmother 56       breast cancer/skin cancer   Heart disease Maternal Grandmother    Diabetes Maternal Grandmother    Kidney disease Maternal Grandmother    Hyperlipidemia Maternal Grandmother    Hypertension Maternal Grandmother    Arthritis Maternal Grandmother    Mental illness Maternal Grandmother    Heart disease Maternal  Grandfather    Cancer Maternal Grandfather 60       prostate cancer   Diabetes Maternal Grandfather    Hyperlipidemia Maternal Grandfather    Mental illness Maternal Grandfather    Cancer Maternal Uncle        liver cancer   Kidney disease Maternal Uncle    Mental illness Paternal Grandmother    Hypertension Paternal Grandmother    Hypertension Mother    Mental illness Mother    Arthritis Father    Hyperlipidemia Father    Heart disease Father    Stroke Maternal Aunt    Heart disease Paternal Uncle    Cancer Maternal Uncle        Colon    Allergies: Allergies  Allergen Reactions   Latex Swelling   Warfarin And Related Dermatitis    Medications Prior to Admission  Medication Sig Dispense Refill Last Dose   labetalol (NORMODYNE) 200 MG tablet Take 2 tablets (400 mg total) by mouth 3 (three) times daily. 180 tablet 0 04/04/2022 at 0900   pantoprazole (PROTONIX) 40 MG tablet Take 1 tablet (40 mg total) by mouth daily. 30 tablet 3 04/03/2022   acetaminophen (TYLENOL) 500 MG tablet Take 1 tablet (500 mg total) by mouth every 6 (six) hours as needed. 30 tablet 0 Unknown   aspirin EC 81 MG tablet Take 1 tablet (81 mg total) by mouth daily. Take after 12 weeks for prevention of preeclampsia later in pregnancy 300 tablet 2 04/02/2022   cyclobenzaprine (FLEXERIL) 10 MG tablet Take 1 tablet (10 mg total) by mouth every 8 (eight) hours as needed for muscle spasms. 30 tablet 1 Unknown   diphenhydrAMINE (BENADRYL) 50 MG capsule Take 1 capsule (50 mg total) by mouth every 6 (six) hours as needed. 30 capsule 0 Unknown   Magnesium Oxide 400 MG CAPS Take 1 capsule (400 mg total) by mouth daily as needed. 30 capsule 1 Unknown   ondansetron (ZOFRAN) 4 MG tablet Take 1 tablet (4 mg total) by mouth every 8 (eight) hours as needed for nausea or vomiting. 20 tablet 0 Unknown   Prenatal Vit-Fe Fumarate-FA (PREPLUS) 27-1 MG TABS Take 1 tablet by mouth daily. 30 tablet 13 04/02/2022   promethazine  (PHENERGAN) 25 MG tablet Take 1 tablet (25  mg total) by mouth every 6 (six) hours as needed for nausea or vomiting (headache). 30 tablet 2 Unknown     Review of Systems   All systems reviewed and negative except as stated in HPI  Blood pressure 138/83, pulse 85, temperature 97.7 F (36.5 C), temperature source Oral, resp. rate 19, height 5' 4.5" (1.638 m), weight 110.8 kg, last menstrual period 07/31/2021, SpO2 99 %. General appearance: alert, cooperative, and appears stated age Lungs: clear to auscultation bilaterally Heart: regular rate and rhythm Abdomen: soft, non-tender; bowel sounds normal Pelvic: Sterile speculum examination performed, shows copious pooling of clear fluid in vaginal vault.  See digital exam findings below. Extremities: Homans sign is negative, no sign of DVT Presentation: cephalic Fetal monitoring 130 bpm, moderate variability, greater than 215 x 15 accelerations, no decelerations. Uterine activityNone Dilation: 2 Effacement (%): 70 Station: -2 Exam by:: Uva Runkel, MD   Prenatal labs: ABO, Rh: --/--/O POS (08/17 1315) Antibody: NEG (08/17 1315) Rubella: 1.34 (02/27 1609) RPR: Non Reactive (06/29 0859)  HBsAg: Negative (02/27 1609)  HIV: Non Reactive (06/29 0859)  GBS:   collected and pending. 2 hr Glucola normal Genetic screening normal Anatomy US normal  Prenatal Transfer Tool  Maternal Diabetes: No Genetic Screening: Normal Maternal Ultrasounds/Referrals: Normal Fetal Ultrasounds or other Referrals:  None Maternal Substance Abuse:  No Significant Maternal Medications:  Meds include: Other: Labetalol 400 mg TID Significant Maternal Lab Results: None  Results for orders placed or performed during the hospital encounter of 04/04/22 (from the past 24 hour(s))  CBC   Collection Time: 04/04/22  1:15 PM  Result Value Ref Range   WBC 12.5 (H) 4.0 - 10.5 K/uL   RBC 4.41 3.87 - 5.11 MIL/uL   Hemoglobin 12.3 12.0 - 15.0 g/dL   HCT 48.5 46.2 - 70.3 %    MCV 85.7 80.0 - 100.0 fL   MCH 27.9 26.0 - 34.0 pg   MCHC 32.5 30.0 - 36.0 g/dL   RDW 50.0 93.8 - 18.2 %   Platelets 284 150 - 400 K/uL   nRBC 0.0 0.0 - 0.2 %  Type and screen [facility]   Collection Time: 04/04/22  1:15 PM  Result Value Ref Range   ABO/RH(D) O POS    Antibody Screen NEG    Sample Expiration      04/07/2022,2359 Performed at [facility], [address], [place], [place] [address]     Patient Active Problem List   Diagnosis Date Noted   Chronic hypertension during pregnancy, antepartum 11/29/2021   BMI 40.0-44.9, adult (HCC) 11/29/2021   Nausea and vomiting during pregnancy 11/29/2021   Supervision of high risk pregnancy, antepartum 10/15/2021   Maternal morbid obesity, antepartum (HCC) 10/15/2021   Bilateral lower extremity edema 05/27/2018   Pseudotumor cerebri 11/09/2015   Anxiety and depression 12/19/2014   HLD (hyperlipidemia) 12/19/2014   PCOS (polycystic ovarian syndrome) 08/07/2012    Assessment/Plan:  Lisabeth Devoid is a 26 y.o. G1P0000 at [redacted]w[redacted]d here with preterm PROM.  She is grossly ruptured on examination here in MAU.  We discussed need for admission and delivery.  GBS collected.  We had a long discussion about the risk/benefits of giving steroids for fetal lung maturity, at this stage of pregnancy.  Following our discussion, patient and spouse made a decision to decline betamethasone, given concerns for its potential neurological risks.    #Labor: Admit to L&D, for induction for preterm PROM.  Dr. Charlotta Newton discussed with NICU team.  Given baby is more than 35 weeks and estimated fetal weight >2 kg, okay to admit. #Pain: Desires epidural when active labor #FWB: Reactive NST, not currently contracting #ID:  GBS pending-we will get penicillin in labor given preterm. #MOF: Breast-feeding #MOC: Considering a tubal #Circ:  Yes  Sheppard Evens MD MPH OB Fellow, Faculty Practice

## 2022-04-04 NOTE — Progress Notes (Signed)
Patient Vitals for the past 4 hrs:  BP Temp Temp src Pulse Resp  04/04/22 2107 (!) 122/58 98.5 F (36.9 C) Axillary 68 --  04/04/22 2018 111/64 -- -- 65 --  04/04/22 1949 135/78 -- -- 66 18   Amnioinfusion bolus completed, all positions tried.  Variable decels still +.  Ctx q 2-3 minutes. Moderate variability. Cx 6/90/-2.  Dr. Jolayne Panther given update. Terbutaline 0.25mg  given sq @ 2245.  Pt now sitting for epidural, ctx spaced out some.  Continue to monitor closely.

## 2022-04-04 NOTE — MAU Provider Note (Addendum)
Brief MAU provider note:  26 year old G1, P0 at 35 weeks and 2 days, here with leakage of fluid. No other acute concerns.  No vaginal bleeding, feels fetal movement.  History of cHTN on labetalol 400mg  TID and well controlled Sterile speculum exam shows moderate amount of clear fluid in vaginal vault.   Currently having no contractions.   We discussed risk and benefits of giving steroids to mature fetal lungs, she declined these. EFW: 1788g at [redacted] weeks gestation (about 3 weeks)  She is being admitted for induction of labor.  Please see H&P for more detail.  MD MPH OB Fellow, Faculty Practice

## 2022-04-04 NOTE — Progress Notes (Signed)
LABOR PROGRESS NOTE  Carmen Reid is a 26 y.o. G1P0000 at [redacted]w[redacted]d  admitted for PROM.   Subjective: Currently doing well. Feeling contractions every 5 min.   Objective: BP (!) 144/91 (BP Location: Left Arm)   Pulse 82   Temp 98.6 F (37 C) (Axillary)   Resp 18   Ht 5' 4.5" (1.638 m)   Wt 110.8 kg   LMP 07/31/2021 (Approximate)   SpO2 99%   BMI 41.29 kg/m  or  Vitals:   04/04/22 1415 04/04/22 1416 04/04/22 1420 04/04/22 1523  BP:  138/83  (!) 144/91  Pulse:  85  82  Resp:    18  Temp:    98.6 F (37 C)  TempSrc:    Axillary  SpO2: 99%  99%   Weight:      Height:        Dilation: 2.5 Effacement (%): 70 Cervical Position: Posterior Station: -2 Presentation: Vertex Exam by:: Dr Nobie Putnam FHT: baseline rate 135 , moderate varibility, + acel, no decel Toco: difficult to trace   Labs: Lab Results  Component Value Date   WBC 12.5 (H) 04/04/2022   HGB 12.3 04/04/2022   HCT 37.8 04/04/2022   MCV 85.7 04/04/2022   PLT 284 04/04/2022    Patient Active Problem List   Diagnosis Date Noted   Preterm PROM with onset of labor within 24 hours of rupture 04/04/2022   Chronic hypertension during pregnancy, antepartum 11/29/2021   BMI 40.0-44.9, adult (HCC) 11/29/2021   Nausea and vomiting during pregnancy 11/29/2021   Supervision of high risk pregnancy, antepartum 10/15/2021   Maternal morbid obesity, antepartum (HCC) 10/15/2021   Bilateral lower extremity edema 05/27/2018   Pseudotumor cerebri 11/09/2015   Anxiety and depression 12/19/2014   HLD (hyperlipidemia) 12/19/2014   PCOS (polycystic ovarian syndrome) 08/07/2012    Assessment / Plan: 26 y.o. G1P0000 at [redacted]w[redacted]d here for PROM   Labor: Contracting. Foley balloon placed at this check. Will give one dose of 50 mcg cytotec and plan for recheck in 4 hours.  Fetal Wellbeing:  cat 1  Pain Control:  prn epidural  Anticipated MOD:  vaginal  Chronic HTN: currently on labetalol 400 mg tid. BP normotensive to  mild range elevation. Pre E labs neg. Will continue home medications  ID: GBS neg   Derrel Nip, MD  04/04/2022, 6:13 PM

## 2022-04-04 NOTE — Anesthesia Preprocedure Evaluation (Signed)
Anesthesia Evaluation  Patient identified by MRN, date of birth, ID band Patient awake    Reviewed: Allergy & Precautions, NPO status , Patient's Chart, lab work & pertinent test results  Airway Mallampati: II  TM Distance: >3 FB Neck ROM: Full    Dental no notable dental hx.    Pulmonary neg pulmonary ROS,    Pulmonary exam normal breath sounds clear to auscultation       Cardiovascular hypertension, Normal cardiovascular exam Rhythm:Regular Rate:Normal     Neuro/Psych Anxiety Depression negative neurological ROS  negative psych ROS   GI/Hepatic negative GI ROS, Neg liver ROS,   Endo/Other  negative endocrine ROS  Renal/GU negative Renal ROS  negative genitourinary   Musculoskeletal negative musculoskeletal ROS (+)   Abdominal (+) + obese,   Peds negative pediatric ROS (+)  Hematology negative hematology ROS (+)   Anesthesia Other Findings   Reproductive/Obstetrics (+) Pregnancy                             Anesthesia Physical Anesthesia Plan  ASA: 3  Anesthesia Plan: Epidural   Post-op Pain Management:    Induction:   PONV Risk Score and Plan:   Airway Management Planned:   Additional Equipment:   Intra-op Plan:   Post-operative Plan:   Informed Consent:   Plan Discussed with:   Anesthesia Plan Comments:         Anesthesia Quick Evaluation

## 2022-04-04 NOTE — Progress Notes (Signed)
Patient Vitals for the past 4 hrs:  BP Temp Temp src Pulse Resp  04/04/22 2107 (!) 122/58 98.5 F (36.9 C) Axillary 68 --  04/04/22 2018 111/64 -- -- 65 --  04/04/22 1949 135/78 -- -- 66 18  04/04/22 1837 (!) 153/91 -- -- 65 --  04/04/22 1815 (!) 170/90 -- -- 66 --   Balloon came out around 2000.  Cx 5.5/80/-2 .  FHR 130s, moderate variability, accels +.  Having some variable decels.  AROM of forebag, IUPC placed.  Ctx q 2-5 minutes, MVUs ~ 180.  Now variables have been deeper and longer (to 70s-80s w/return to baseline after about 1 minute) despite position changes and IVF bolus  Pt is prepping for epidural. Will start amnioinfusion.

## 2022-04-04 NOTE — Anesthesia Procedure Notes (Signed)
Epidural Patient location during procedure: OB Start time: 04/04/2022 10:53 PM End time: 04/04/2022 11:10 PM  Staffing Anesthesiologist: Lowella Curb, MD Performed: anesthesiologist   Preanesthetic Checklist Completed: patient identified, IV checked, site marked, risks and benefits discussed, surgical consent, monitors and equipment checked, pre-op evaluation and timeout performed  Epidural Patient position: sitting Prep: ChloraPrep Patient monitoring: heart rate, cardiac monitor, continuous pulse ox and blood pressure Approach: midline Location: L2-L3 Injection technique: LOR saline  Needle:  Needle type: Tuohy  Needle gauge: 17 G Needle length: 9 cm Needle insertion depth: 6 cm Catheter type: closed end flexible Catheter size: 20 Guage Catheter at skin depth: 10 cm Test dose: negative  Assessment Events: blood not aspirated, injection not painful, no injection resistance, no paresthesia and negative IV test  Additional Notes Reason for block:procedure for pain

## 2022-04-05 ENCOUNTER — Encounter (HOSPITAL_COMMUNITY): Payer: Self-pay | Admitting: Obstetrics & Gynecology

## 2022-04-05 ENCOUNTER — Ambulatory Visit: Payer: Medicaid Other

## 2022-04-05 ENCOUNTER — Other Ambulatory Visit: Payer: Commercial Managed Care - HMO

## 2022-04-05 DIAGNOSIS — Z30430 Encounter for insertion of intrauterine contraceptive device: Secondary | ICD-10-CM

## 2022-04-05 DIAGNOSIS — O1002 Pre-existing essential hypertension complicating childbirth: Secondary | ICD-10-CM

## 2022-04-05 DIAGNOSIS — Z3A35 35 weeks gestation of pregnancy: Secondary | ICD-10-CM

## 2022-04-05 DIAGNOSIS — O4202 Full-term premature rupture of membranes, onset of labor within 24 hours of rupture: Secondary | ICD-10-CM

## 2022-04-05 LAB — CBC
HCT: 34.8 % — ABNORMAL LOW (ref 36.0–46.0)
Hemoglobin: 11.4 g/dL — ABNORMAL LOW (ref 12.0–15.0)
MCH: 28.1 pg (ref 26.0–34.0)
MCHC: 32.8 g/dL (ref 30.0–36.0)
MCV: 85.9 fL (ref 80.0–100.0)
Platelets: 254 10*3/uL (ref 150–400)
RBC: 4.05 MIL/uL (ref 3.87–5.11)
RDW: 14.6 % (ref 11.5–15.5)
WBC: 18.9 10*3/uL — ABNORMAL HIGH (ref 4.0–10.5)
nRBC: 0 % (ref 0.0–0.2)

## 2022-04-05 LAB — RPR: RPR Ser Ql: NONREACTIVE

## 2022-04-05 MED ORDER — SIMETHICONE 80 MG PO CHEW: 80.0000 mg | CHEWABLE_TABLET | ORAL | Status: DC | PRN

## 2022-04-05 MED ORDER — WITCH HAZEL-GLYCERIN EX PADS: 1.0000 | MEDICATED_PAD | CUTANEOUS | Status: DC | PRN

## 2022-04-05 MED ORDER — BENZOCAINE-MENTHOL 20-0.5 % EX AERO: 1.0000 | INHALATION_SPRAY | CUTANEOUS | Status: DC | PRN

## 2022-04-05 MED ORDER — ZOLPIDEM TARTRATE 5 MG PO TABS: 5.0000 mg | ORAL_TABLET | Freq: Every evening | ORAL | Status: DC | PRN

## 2022-04-05 MED ORDER — FENTANYL CITRATE (PF) 100 MCG/2ML IJ SOLN
INTRAMUSCULAR | Status: DC | PRN
  Administered 2022-04-05: 100 ug via EPIDURAL

## 2022-04-05 MED ORDER — COCONUT OIL OIL: 1.0000 | TOPICAL_OIL | Status: DC | PRN

## 2022-04-05 MED ORDER — ONDANSETRON HCL 4 MG PO TABS: 4.0000 mg | ORAL_TABLET | ORAL | Status: DC | PRN

## 2022-04-05 MED ORDER — PRENATAL MULTIVITAMIN CH
1.0000 | ORAL_TABLET | Freq: Every day | ORAL | Status: DC
  Administered 2022-04-06 – 2022-04-07 (×2): 1 via ORAL
  Filled 2022-04-05 (×2): qty 1

## 2022-04-05 MED ORDER — DIBUCAINE (PERIANAL) 1 % EX OINT: 1.0000 | TOPICAL_OINTMENT | CUTANEOUS | Status: DC | PRN

## 2022-04-05 MED ORDER — ACETAMINOPHEN 325 MG PO TABS: 650.0000 mg | ORAL_TABLET | ORAL | Status: DC | PRN

## 2022-04-05 MED ORDER — ONDANSETRON HCL 4 MG/2ML IJ SOLN: 4.0000 mg | INTRAMUSCULAR | Status: DC | PRN

## 2022-04-05 MED ORDER — DIPHENHYDRAMINE HCL 25 MG PO CAPS: 25.0000 mg | ORAL_CAPSULE | Freq: Four times a day (QID) | ORAL | Status: DC | PRN

## 2022-04-05 MED ORDER — BUPIVACAINE HCL (PF) 0.25 % IJ SOLN
INTRAMUSCULAR | Status: DC | PRN
  Administered 2022-04-05: 8 mL via EPIDURAL

## 2022-04-05 MED ORDER — IBUPROFEN 600 MG PO TABS
600.0000 mg | ORAL_TABLET | Freq: Four times a day (QID) | ORAL | Status: DC
  Administered 2022-04-05 – 2022-04-07 (×8): 600 mg via ORAL
  Filled 2022-04-05 (×8): qty 1

## 2022-04-05 MED ORDER — TETANUS-DIPHTH-ACELL PERTUSSIS 5-2.5-18.5 LF-MCG/0.5 IM SUSY: 0.5000 mL | PREFILLED_SYRINGE | Freq: Once | INTRAMUSCULAR | Status: DC

## 2022-04-05 MED ORDER — SENNOSIDES-DOCUSATE SODIUM 8.6-50 MG PO TABS
2.0000 | ORAL_TABLET | Freq: Every day | ORAL | Status: DC
  Administered 2022-04-06 – 2022-04-07 (×2): 2 via ORAL
  Filled 2022-04-05 (×2): qty 2

## 2022-04-05 NOTE — Lactation Note (Addendum)
This note was copied from a baby's chart. Lactation Consultation Note  Patient Name: Carmen Reid YJEHU'D Date: 04/05/2022 Reason for consult: Initial assessment;1st time breastfeeding;Late-preterm 34-36.6wks;Infant < 6lbs Age:26 hours, P1, LPTI See Birth Parent MR: PCOS and CHTN Per Birth Parent plan is to " Pump only " and formula feed infant.  LC explained how to use DEBP, Birth Parent was pumping when LC in room, while Support Person was formula feeding infant using slow flow bottle nipple ( pace feeding). Birth Parent knows to use DEBP every 3 hours for 15 minutes on initial setting, and give infant back any EBM if expressed by finger feeding or spoon, if larger volume use slow flow bottle nipple. LC gave Birth Parent LPTI infant feeding policy ( green sheet) and explained to limit infant total feedings  to 30 minutes or less,  pace feeding, BF infant 8 to 12+ times within 24 hours, and due to  infant not latching at breast on day 1 of life to offer (10-15 mls) per feeding. Birth Parent knows to call RN/LC if their are any BF questions or concerns. Birth Parent was shown how to use DEBP & how to disassemble, clean, & reassemble parts.  Birth Parent  made aware of O/P services, breastfeeding support groups, community resources, and our phone # for post-discharge questions.   Maternal Data Does the patient have breastfeeding experience prior to this delivery?: No  Feeding Mother's Current Feeding Choice: Breast Milk and Formula Nipple Type: Slow - flow  LATCH Score                    Lactation Tools Discussed/Used    Interventions Interventions: Breast feeding basics reviewed;Expressed milk;DEBP;LC Services brochure;LPT handout/interventions;Education;Pace feeding  Discharge Pump: DEBP (Per Birth Parent, has [employer] DEBP at home.)  Consult Status Consult Status: Follow-up Date: 04/06/22 Follow-up type: In-patient    Danelle Earthly 04/05/2022, 7:55 PM

## 2022-04-05 NOTE — Anesthesia Postprocedure Evaluation (Signed)
Anesthesia Post Note  Patient: Darlisha Kelm [facility]  Procedure(s) Performed: AN AD HOC LABOR EPIDURAL     Patient location during evaluation: Mother Baby Anesthesia Type: Epidural Level of consciousness: awake, awake and alert and oriented Pain management: pain level controlled Vital Signs Assessment: post-procedure vital signs reviewed and stable Respiratory status: spontaneous breathing, nonlabored ventilation and respiratory function stable Cardiovascular status: stable Postop Assessment: no headache, patient able to bend at knees, no apparent nausea or vomiting, adequate PO intake, able to ambulate and no backache Anesthetic complications: no   No notable events documented.  Last Vitals:  Vitals:   04/05/22 1110 04/05/22 1215  BP: 137/78 (!) 140/86  Pulse: 79 91  Resp: 18 18  Temp: 36.7 C   SpO2:      Last Pain:  Vitals:   04/05/22 1440  TempSrc:   PainSc: 0-No pain   Pain Goal:                   Indiyah Paone

## 2022-04-05 NOTE — Progress Notes (Signed)
Patient Vitals for the past 4 hrs:  BP Temp Temp src Pulse SpO2  04/05/22 0100 127/71 98.7 F (37.1 C) Axillary 88 --  04/05/22 0000 (!) 141/81 -- -- 88 --  04/04/22 2330 129/71 -- -- 92 --  04/04/22 2325 128/78 -- -- 91 --  04/04/22 2320 123/73 -- -- 94 98 %  04/04/22 2315 126/72 -- -- 92 99 %  04/04/22 2310 125/73 -- -- 80 98 %  04/04/22 2305 -- -- -- -- 99 %  04/04/22 2300 (!) 143/86 -- -- 86 98 %   Comfortable w/epidural.  Per Dr. Deretha Emory orders, pt received a 2nd dose of terbutaline when the 1st dose did not seem to have any effect on ctx frequency.  Ctx slowed down to q 1-8 minutes.  FHR now 130s, moderate variability, + accels, still the occasional variable or early decel.  Cx 6.5/90/-2 on last exam.  Amnioinfusion stopped d/t minimal fluid return.  Continue present mgt

## 2022-04-05 NOTE — Progress Notes (Signed)
Vitals:   04/05/22 0729 04/05/22 0730  BP:  136/74  Pulse:  72  Resp:    Temp: 98.2 F (36.8 C)   SpO2:     FHR has been Cat 1&2 most of the night.  Always good variability, + accels, occ variable/early/late decel.  MVUs 110-140.  However, her cx is now 9.5/100/0 station.  Dr. Jolayne Panther aware.  Will not intervene at this time, consider pitocin during 2nd stage d/t infrequency of ctx

## 2022-04-06 MED ORDER — NIFEDIPINE ER OSMOTIC RELEASE 30 MG PO TB24
30.0000 mg | ORAL_TABLET | Freq: Every day | ORAL | Status: DC
  Administered 2022-04-06 – 2022-04-07 (×2): 30 mg via ORAL
  Filled 2022-04-06 (×2): qty 1

## 2022-04-06 MED ORDER — FUROSEMIDE 20 MG PO TABS
20.0000 mg | ORAL_TABLET | Freq: Every day | ORAL | Status: DC
  Administered 2022-04-06 – 2022-04-07 (×2): 20 mg via ORAL
  Filled 2022-04-06 (×2): qty 1

## 2022-04-06 NOTE — Progress Notes (Signed)
MOB was referred for history of depression/anxiety. * Referral screened out by Clinical Social Worker because none of the following criteria appear to apply: ~ History of anxiety/depression during this pregnancy, or of post-partum depression following prior delivery. ~ Diagnosis of anxiety and/or depression within last 3 years OR * MOB's symptoms currently being treated with medication and/or therapy. Please contact the Clinical Social Worker if needs arise, by MOB request, or if MOB scores greater than 9/yes to question 10 on Edinburgh Postpartum Depression Screen.  Signed,  Linetta Regner K. Akili Corsetti, MSW, LCSWA, LCASA 04/06/2022 8:52 AM 

## 2022-04-06 NOTE — Progress Notes (Signed)
Post Partum Day 1  Subjective: Doing well. No acute events overnight. Pain is controlled and bleeding is appropriate. She is eating, drinking, voiding, and ambulating without issue. Passing flatus, no BM.  Lochia appropriate.  She is breastfeeding feeding which is going well. She has no other concerns at this time.  Denies HA, blurry vision or RUQ pain.  Objective: Blood pressure (!) 142/91, pulse 88, temperature 98.4 F (36.9 C), temperature source Oral, resp. rate 18, height 5' 4.5" (1.638 m), weight 110.8 kg, last menstrual period 07/31/2021, SpO2 99 %, unknown if currently breastfeeding. BP range: 131-145/67-99  Physical Exam:  General: alert, cooperative, and no distress CV: RRR Lungs CTAB Lochia: appropriate Uterine Fundus: firm DVT Evaluation: No evidence of DVT seen on physical exam.  Recent Labs    04/04/22 2143 04/05/22 1035  HGB 11.9* 11.4*  HCT 35.4* 34.8*    Assessment/Plan: [employer] is a 26 y.o. s/p NSVD on PPD# 1  -Chronic HTN  BP elevated, plan to add Procardia XL 30mg  daily  Continue Labetalol 400mg  q 8hr  Currently asymptomatic, will continue to monitor -Progressing well. Meeting postpartum milestones.  -Continue routine postpartum care.  Feeding: breastfeeding Contraception: postpartum IUD Circumcision: desires circumcision- inform consent obtained, below information reviewed  Circumcision Consent  Discussed with mom at bedside about circumcision.   Circumcision is a surgery that removes the skin that covers the tip of the penis, called the "foreskin." Circumcision is usually done when a boy is between 21 and 68 days old, sometimes up to 54-67 weeks old.  The most common reasons boys are circumcised include for cultural/religious beliefs or for parental preference (potentially easier to clean, so baby looks like daddy, etc).  There may be some medical benefits for circumcision:   Circumcised boys seem to have slightly lower rates  of: ? Urinary tract infections (per the American Academy of Pediatrics an uncircumcised boy has a 1/100 chance of developing a UTI in the first year of life, a circumcised boy at a 08/998 chance of developing a UTI in the first year of life- a 10% reduction) ? Penis cancer (typically rare- an uncircumcised female has a 1 in 100,000 chance of developing cancer of the penis) ? Sexually transmitted infection (in endemic areas, including HIV, HPV and Herpes- circumcision does NOT protect against gonorrhea, chlamydia, trachomatis, or syphilis) ? Phimosis: a condition where that makes retraction of the foreskin over the glans impossible (0.4 per 1000 boys per year or 0.6% of boys are affected by their 15th birthday)  Boys and men who are not circumcised can reduce these extra risks by: ? Cleaning their penis well ? Using condoms during sex  What are the risks of circumcision?  As with any surgical procedure, there are risks and complications. In circumcision, complications are rare and usually minor, the most common being: ? Bleeding- risk is reduced by holding each clamp for 30 seconds prior to a cut being made, and by holding pressure after the procedure is done ? Infection- the penis is cleaned prior to the procedure, and the procedure is done under sterile technique ? Damage to the urethra or amputation of the penis   What to expect:  The penis will look red and raw for 5-7 days as it heals. We expect scabbing around where the cut was made, as well as clear-pink fluid and some swelling of the penis right after the procedure. If your baby's circumcision starts to bleed or develops pus, please contact your pediatrician  immediately.  All questions were answered and mother consented.  Myna Hidalgo, DO Attending Obstetrician & Gynecologist, [facility] for [employer], [facility]

## 2022-04-06 NOTE — Lactation Note (Signed)
This note was copied from a baby's chart. Lactation Consultation Note  Patient Name: Carmen Reid IRSWN'I Date: 04/06/2022 Reason for consult: Follow-up assessment;Late-preterm 34-36.6wks Age:26 hours Per Birth Parent , she did not like using the DEBP or how it feels their was no pain when using the DEBP.  Birth Parent  decided to  only formula feed infant. Per Birth Parent , infant is being pace feed and taking 15 to 20 mls of formula per feeding every 2 to 3 hours. Infant was circumcised earlier today and Birth Parent knows to wake LPTI if past 3 hours for a feeding. LC closed out LC services due Birth Parent choice is formula only.  Maternal Data    Feeding Mother's Current Feeding Choice: Formula Nipple Type: Extra Slow Flow  LATCH Score                    Lactation Tools Discussed/Used    Interventions Interventions: LPT handout/interventions;Pace feeding  Discharge    Consult Status Consult Status: Complete Date: 04/06/22 Follow-up type: In-patient    Danelle Earthly 04/06/2022, 8:54 PM

## 2022-04-07 MED ORDER — FUROSEMIDE 20 MG PO TABS: 20.0000 mg | ORAL_TABLET | Freq: Every day | ORAL | 0 refills | Status: DC

## 2022-04-07 MED ORDER — NIFEDIPINE ER 30 MG PO TB24: 30.0000 mg | ORAL_TABLET | Freq: Every day | ORAL | 1 refills | Status: DC

## 2022-04-07 NOTE — Progress Notes (Signed)
Patient states she has a blood pressure cuff at home that was provided to her at the beginning of her pregnancy. This RN set up Babyscripts through epic and patient's blood pressure documentation is visible in her "OB home device data." Earl Gala, [address]

## 2022-04-07 NOTE — Discharge Summary (Signed)
Postpartum Discharge Summary     Patient Name: Carmen Reid [facility] DOB: October 19, 1995 MRN: [id number]  Date of admission: 04/04/2022 Delivery date:04/05/2022  Delivering provider: Starr Lake  Date of discharge: 04/07/2022  Admitting diagnosis: Preterm PROM with onset of labor within 24 hours of rupture [O42.019] Intrauterine pregnancy: [redacted]w[redacted]d    Secondary diagnosis:  Principal Problem:   Preterm PROM with onset of labor within 24 hours of rupture  Additional problems: CHTN    Discharge diagnosis: Preterm Pregnancy Delivered and CHTN                                              Post partum procedures: pp IUD Augmentation: AROM, Pitocin, Cytotec, and IP Foley Complications: None  Hospital course: Onset of Labor With Vaginal Delivery      26y.o. yo G1P0101 at 35w3das admitted with PPROM on 04/04/2022. Patient had an uncomplicated labor course as follows:  Membrane Rupture Time/Date: 2:00 AM ,04/04/2022  Given foley balloon and cytotec. Had category 2 tracing and given terb and amnioinfusion, progressed to complete  Delivery Method:Vaginal, Spontaneous  Episiotomy: None  Lacerations:  Periurethral  Patient had a postpartum course with continuation of her labetalol and started on lasix and procardia with good BP control.  She is ambulating, tolerating a regular diet, passing flatus, and urinating well. Patient is discharged home in stable condition on 04/07/22.  Newborn Data: Birth date:04/05/2022  Birth time:9:06 AM  Gender:Female  Living status:Living  Apgars:9 ,9  Weight:2260 g   Magnesium Sulfate received: No BMZ received: No Rhophylac:N/A MMR:N/A T-DaP: declined Flu: N/A Transfusion:No  Physical exam  Vitals:   04/06/22 1011 04/06/22 1514 04/06/22 2050 04/07/22 0522  BP: (!) 143/87 (!) 129/90 125/85 128/82  Pulse: 90 90 88 84  Resp:  _0 Temp:  98.2 F (36.8 C) 98.1 F (36.7 C) 97.8 F (36.6 C)  TempSrc:  Oral Oral Oral  SpO2:  100% 100%  100%  Weight:      Height:       General: alert, cooperative, and no distress Lochia: appropriate Uterine Fundus: firm DVT Evaluation: No evidence of DVT seen on physical exam. Labs: Lab Results  Component Value Date   WBC 18.9 (H) 04/05/2022   HGB 11.4 (L) 04/05/2022   HCT 34.8 (L) 04/05/2022   MCV 85.9 04/05/2022   PLT 254 04/05/2022      Latest Ref Rng & Units 04/04/2022    1:15 PM  CMP  Glucose 70 - 99 mg/dL 91   BUN 6 - 20 mg/dL 9   Creatinine 0.44 - 1.00 mg/dL 0.61   Sodium 135 - 145 mmol/L 135   Potassium 3.5 - 5.1 mmol/L 4.0   Chloride 98 - 111 mmol/L 107   CO2 22 - 32 mmol/L 16   Calcium 8.9 - 10.3 mg/dL 8.9   Total Protein 6.5 - 8.1 g/dL 5.9   Total Bilirubin 0.3 - 1.2 mg/dL 0.2   Alkaline Phos 38 - 126 U/L 82   AST 15 - 41 U/L 13   ALT 0 - 44 U/L 8    Edinburgh Score:    04/05/2022   11:10 AM  Edinburgh Postnatal Depression Scale Screening Tool  I have been able to laugh and see the funny side of things. 0  I have looked forward with enjoyment to things. 0  I have blamed myself unnecessarily when things went wrong. 1  I have been anxious or worried for no good reason. 2  I have felt scared or panicky for no good reason. 1  Things have been getting on top of me. 0  I have been so unhappy that I have had difficulty sleeping. 0  I have felt sad or miserable. 0  I have been so unhappy that I have been crying. 0  The thought of harming myself has occurred to me. 0  Edinburgh Postnatal Depression Scale Total 4     After visit meds:  Allergies as of 04/07/2022       Reactions   Latex Swelling   Warfarin And Related Dermatitis        Medication List     TAKE these medications    acetaminophen 500 MG tablet Commonly known as: TYLENOL Take 1 tablet (500 mg total) by mouth every 6 (six) hours as needed.   aspirin EC 81 MG tablet Take 1 tablet (81 mg total) by mouth daily. Take after 12 weeks for prevention of preeclampsia later in pregnancy    cyclobenzaprine 10 MG tablet Commonly known as: FLEXERIL Take 1 tablet (10 mg total) by mouth every 8 (eight) hours as needed for muscle spasms.   diphenhydrAMINE 50 MG capsule Commonly known as: BENADRYL Take 1 capsule (50 mg total) by mouth every 6 (six) hours as needed.   furosemide 20 MG tablet Commonly known as: LASIX Take 1 tablet (20 mg total) by mouth daily for 5 days. Start taking on: April 08, 2022   labetalol 200 MG tablet Commonly known as: NORMODYNE Take 2 tablets (400 mg total) by mouth 3 (three) times daily.   Magnesium Oxide 400 MG Caps Take 1 capsule (400 mg total) by mouth daily as needed.   NIFEdipine 30 MG 24 hr tablet Commonly known as: ADALAT CC Take 1 tablet (30 mg total) by mouth daily. Start taking on: April 08, 2022   ondansetron 4 MG tablet Commonly known as: Zofran Take 1 tablet (4 mg total) by mouth every 8 (eight) hours as needed for nausea or vomiting.   pantoprazole 40 MG tablet Commonly known as: Protonix Take 1 tablet (40 mg total) by mouth daily.   PrePLUS 27-1 MG Tabs Take 1 tablet by mouth daily.   promethazine 25 MG tablet Commonly known as: PHENERGAN Take 1 tablet (25 mg total) by mouth every 6 (six) hours as needed for nausea or vomiting (headache).         Discharge home in stable condition Infant Feeding: Breast Infant Disposition:home with mother Discharge instruction: per After Visit Summary and Postpartum booklet. Activity: Advance as tolerated. Pelvic rest for 6 weeks.  Diet: low salt diet Future Appointments: Future Appointments  Date Time Provider Department Center  04/10/2022  3:50 PM Donnamae Jude, MD [facility]  04/17/2022  4:10 PM Donnamae Jude, MD [facility]  04/24/2022  4:10 PM Donnamae Jude, MD [facility]  05/01/2022  4:10 PM Donnamae Jude, MD [facility]  05/07/2022  3:30 PM Constant, Vickii Chafe, MD [facility]   Follow up Visit:  [place] [facility] at [facility] Follow up.   Specialty: Obstetrics and Gynecology Contact information: [place] [phone]                 Please schedule this patient for a In person postpartum  visit in 4 weeks with the following provider: Any provider. Additional Postpartum F/U:BP check 1 week  High risk pregnancy complicated by: HTN Delivery mode:  Vaginal, Spontaneous  Anticipated Birth Control:  PP IUD placed   04/07/2022 Donnamae Jude, MD

## 2022-04-10 ENCOUNTER — Encounter: Payer: Medicaid Other | Admitting: Family Medicine

## 2022-04-12 ENCOUNTER — Ambulatory Visit: Payer: Commercial Managed Care - HMO

## 2022-04-15 ENCOUNTER — Telehealth (HOSPITAL_COMMUNITY): Payer: Self-pay | Admitting: *Deleted

## 2022-04-15 ENCOUNTER — Ambulatory Visit: Payer: Medicaid Other

## 2022-04-15 NOTE — Telephone Encounter (Signed)
Attempted hospital discharge follow-up call. Left message for patient to return RN call with any questions or concerns. Deforest Hoyles, RN, 04/15/22, [phone]

## 2022-04-17 ENCOUNTER — Other Ambulatory Visit: Payer: Self-pay | Admitting: Obstetrics and Gynecology

## 2022-04-17 ENCOUNTER — Encounter: Payer: Medicaid Other | Admitting: Family Medicine

## 2022-04-17 DIAGNOSIS — O10919 Unspecified pre-existing hypertension complicating pregnancy, unspecified trimester: Secondary | ICD-10-CM

## 2022-04-19 ENCOUNTER — Other Ambulatory Visit: Payer: Self-pay | Admitting: Obstetrics & Gynecology

## 2022-04-19 ENCOUNTER — Other Ambulatory Visit: Payer: Self-pay | Admitting: Obstetrics and Gynecology

## 2022-04-19 DIAGNOSIS — O10919 Unspecified pre-existing hypertension complicating pregnancy, unspecified trimester: Secondary | ICD-10-CM

## 2022-04-24 ENCOUNTER — Encounter: Payer: Commercial Managed Care - HMO | Admitting: Family Medicine

## 2022-04-30 ENCOUNTER — Other Ambulatory Visit: Payer: Self-pay | Admitting: Family Medicine

## 2022-05-01 ENCOUNTER — Encounter: Payer: Commercial Managed Care - HMO | Admitting: Family Medicine

## 2022-05-07 ENCOUNTER — Encounter: Payer: Commercial Managed Care - HMO | Admitting: Obstetrics and Gynecology

## 2022-05-21 ENCOUNTER — Encounter: Payer: Self-pay | Admitting: Obstetrics and Gynecology

## 2022-05-21 ENCOUNTER — Ambulatory Visit (INDEPENDENT_AMBULATORY_CARE_PROVIDER_SITE_OTHER): Payer: Medicaid Other | Admitting: Obstetrics and Gynecology

## 2022-05-21 MED ORDER — AMLODIPINE BESYLATE 10 MG PO TABS: 10.0000 mg | ORAL_TABLET | Freq: Every day | ORAL | 3 refills | Status: DC

## 2022-05-21 NOTE — Progress Notes (Unsigned)
    [place] Partum Visit Note  Carmen Reid is a 26 y.o. G22P0101 female who presents for a postpartum visit. She is 6 weeks postpartum following a normal spontaneous vaginal delivery.  I have fully reviewed the prenatal and intrapartum course. The delivery was at 35 gestational weeks.  Anesthesia: epidural. Postpartum course has been unremarkable. Baby is doing well. Baby is feeding by bottle - Similac Neosure. Bleeding staining only. Bowel function is normal. Bladder function is normal. Patient is not sexually active. Contraception method is Mirena placed immediately postpartum. Postpartum depression screening: negative.   The pregnancy intention screening data noted above was reviewed. Potential methods of contraception were discussed. The patient elected to proceed with No data recorded.   Edinburgh Postnatal Depression Scale - 05/21/22 1532       Edinburgh Postnatal Depression Scale:  In the Past 7 Days   I have been able to laugh and see the funny side of things. 0    I have looked forward with enjoyment to things. 0    I have blamed myself unnecessarily when things went wrong. 0    I have been anxious or worried for no good reason. 0    I have felt scared or panicky for no good reason. 0    Things have been getting on top of me. 0    I have been so unhappy that I have had difficulty sleeping. 0    I have felt sad or miserable. 0    I have been so unhappy that I have been crying. 0    The thought of harming myself has occurred to me. 0    Edinburgh Postnatal Depression Scale Total 0             Review of Systems Pertinent items noted in HPI and remainder of comprehensive ROS otherwise negative.  Objective:  BP (!) 144/98   Pulse 84   Wt 234 lb (106.1 kg)   Breastfeeding No   BMI 39.55 kg/m   General: NAD Abdomen: soft EGBUS with mild b/l erythema labia majora Vagina: no blood, white cottage cheese like d/c at the introitus Cervix: normal. IUD strings  trimmed to 3-4cm  Assessment:  Normal PP Exam  Plan:  *PP: routine care. Diagnosis  Date Value Ref Range Status  11/29/2021   Final   - Negative for intraepithelial lesion or malignancy (NILM)   *CHTN: patient didn't take her labetalol or procardia today and she states that she feels great. She states that when she does take it everyday that she feels tired and has HAs. Her BP was elevated at her new NOB so I told her she likely does need to be on meds for her CHTN. Will stop the labetalol and procardia and switch to norvasc 10mg  qday. She has a BP cuff at home and will follow and RTC in 1wk for RN BP check. Patient to make PCP appointment  RTC: 1wk RN BP check  Aletha Halim, MD Center for [facility], [place]

## 2022-05-22 ENCOUNTER — Encounter: Payer: Self-pay | Admitting: Obstetrics and Gynecology

## 2022-05-22 MED ORDER — FLUCONAZOLE 150 MG PO TABS: 150.0000 mg | ORAL_TABLET | Freq: Once | ORAL | 0 refills | Status: AC

## 2022-05-28 ENCOUNTER — Ambulatory Visit: Payer: Medicaid Other

## 2022-08-18 ENCOUNTER — Other Ambulatory Visit: Payer: Self-pay | Admitting: Obstetrics and Gynecology

## 2022-10-29 ENCOUNTER — Ambulatory Visit (INDEPENDENT_AMBULATORY_CARE_PROVIDER_SITE_OTHER): Payer: Medicaid Other | Admitting: Family

## 2022-10-29 ENCOUNTER — Encounter: Payer: Self-pay | Admitting: Family

## 2022-10-29 VITALS — BP 124/78 | HR 78 | Temp 97.9°F | Ht 64.5 in | Wt 241.6 lb

## 2022-10-29 DIAGNOSIS — E282 Polycystic ovarian syndrome: Secondary | ICD-10-CM | POA: Diagnosis not present

## 2022-10-29 DIAGNOSIS — Z6841 Body Mass Index (BMI) 40.0 and over, adult: Secondary | ICD-10-CM | POA: Diagnosis not present

## 2022-10-29 DIAGNOSIS — E785 Hyperlipidemia, unspecified: Secondary | ICD-10-CM

## 2022-10-29 DIAGNOSIS — F32A Depression, unspecified: Secondary | ICD-10-CM

## 2022-10-29 DIAGNOSIS — D649 Anemia, unspecified: Secondary | ICD-10-CM

## 2022-10-29 DIAGNOSIS — G932 Benign intracranial hypertension: Secondary | ICD-10-CM | POA: Diagnosis not present

## 2022-10-29 DIAGNOSIS — R442 Other hallucinations: Secondary | ICD-10-CM

## 2022-10-29 DIAGNOSIS — M2550 Pain in unspecified joint: Secondary | ICD-10-CM

## 2022-10-29 DIAGNOSIS — F419 Anxiety disorder, unspecified: Secondary | ICD-10-CM

## 2022-10-29 DIAGNOSIS — R600 Localized edema: Secondary | ICD-10-CM | POA: Insufficient documentation

## 2022-10-29 DIAGNOSIS — Z8739 Personal history of other diseases of the musculoskeletal system and connective tissue: Secondary | ICD-10-CM | POA: Diagnosis not present

## 2022-10-29 DIAGNOSIS — M25431 Effusion, right wrist: Secondary | ICD-10-CM

## 2022-10-29 LAB — BASIC METABOLIC PANEL
BUN: 14 mg/dL (ref 6–23)
CO2: 25 mEq/L (ref 19–32)
Calcium: 9.7 mg/dL (ref 8.4–10.5)
Chloride: 102 mEq/L (ref 96–112)
Creatinine, Ser: 0.75 mg/dL (ref 0.40–1.20)
GFR: 109.37 mL/min (ref >60.00)
Glucose, Bld: 83 mg/dL (ref 70–99)
Potassium: 4 mEq/L (ref 3.5–5.1)
Sodium: 137 mEq/L (ref 135–145)

## 2022-10-29 LAB — IBC + FERRITIN
Ferritin: 60.7 ng/mL (ref 10.0–291.0)
Iron: 92 ug/dL (ref 42–145)
Saturation Ratios: 22.1 % (ref 20.0–50.0)
TIBC: 415.8 ug/dL (ref 250.0–450.0)
Transferrin: 297 mg/dL (ref 212.0–360.0)

## 2022-10-29 LAB — LIPID PANEL
Cholesterol: 238 mg/dL — ABNORMAL HIGH (ref 0–200)
HDL: 43.5 mg/dL (ref >39.00)
NonHDL: 194.3
Total CHOL/HDL Ratio: 5
Triglycerides: 257 mg/dL — ABNORMAL HIGH (ref 0.0–149.0)
VLDL: 51.4 mg/dL — ABNORMAL HIGH (ref 0.0–40.0)

## 2022-10-29 LAB — CBC WITH DIFFERENTIAL/PLATELET
Basophils Absolute: 0 10*3/uL (ref 0.0–0.1)
Basophils Relative: 0.2 % (ref 0.0–3.0)
Eosinophils Absolute: 0.1 10*3/uL (ref 0.0–0.7)
Eosinophils Relative: 1.3 % (ref 0.0–5.0)
HCT: 42.7 % (ref 36.0–46.0)
Hemoglobin: 14.5 g/dL (ref 12.0–15.0)
Lymphocytes Relative: 29.4 % (ref 12.0–46.0)
Lymphs Abs: 3.1 10*3/uL (ref 0.7–4.0)
MCHC: 34 g/dL (ref 30.0–36.0)
MCV: 86.1 fl (ref 78.0–100.0)
Monocytes Absolute: 0.6 10*3/uL (ref 0.1–1.0)
Monocytes Relative: 5.4 % (ref 3.0–12.0)
Neutro Abs: 6.8 10*3/uL (ref 1.4–7.7)
Neutrophils Relative %: 63.7 % (ref 43.0–77.0)
Platelets: 352 10*3/uL (ref 150.0–400.0)
RBC: 4.96 Mil/uL (ref 3.87–5.11)
RDW: 14.4 % (ref 11.5–15.5)
WBC: 10.6 10*3/uL — ABNORMAL HIGH (ref 4.0–10.5)

## 2022-10-29 LAB — SEDIMENTATION RATE: Sed Rate: 32 mm/hr — ABNORMAL HIGH (ref 0–20)

## 2022-10-29 LAB — LDL CHOLESTEROL, DIRECT: Direct LDL: 157 mg/dL

## 2022-10-29 LAB — HEMOGLOBIN A1C: Hgb A1c MFr Bld: 5.2 % (ref 4.6–6.5)

## 2022-10-29 MED ORDER — CITALOPRAM HYDROBROMIDE 20 MG PO TABS: 20.0000 mg | ORAL_TABLET | Freq: Every day | ORAL | 3 refills | Status: DC

## 2022-10-29 NOTE — Assessment & Plan Note (Signed)
On anterior ankle, chronic.  Doing w/u for RF

## 2022-10-29 NOTE — Assessment & Plan Note (Addendum)
Overdue for neurology and ophthalmology f/u.  Referral placed for both to f/u  No recent headaches

## 2022-10-29 NOTE — Assessment & Plan Note (Addendum)
With ongoing joint swelling and stiffness ordering ana rf sed rate pending results.  Will consider rheumatology referral due to h/o to get a full workup.

## 2022-10-29 NOTE — Patient Instructions (Signed)
Let's restart celexa.    Start celexa 10 mg for anxiety and depression. Take 1/2 tablet by mouth once daily for about one week, then increase to 1 full tablet thereafter.   Taking the medicine as directed and not missing any doses is one of the best things you can do to treat your anxiety/depression.  Here are some things to keep in mind:  Side effects (stomach upset, some increased anxiety) may happen before you notice a benefit.  These side effects typically go away over time. Changes to your dose of medicine or a change in medication all together is sometimes necessary Many people will notice an improvement within two weeks but the full effect of the medication can take up to 4-6 weeks Stopping the medication when you start feeling better often results in a return of symptoms. Most people need to be on medication at least 6-12 months If you start having thoughts of hurting yourself or others after starting this medicine, please call me immediately.    ------------------------------------   Here are some recommendations for medicaid therapists.    [facility] Phone # [phone] They have locations in [place], [place], [place], and [place]. They do take [employer] I know for sure and the [employer].   [address] [phone]  [place], [place], [place] [address]    [employer] [facility] [place], [place] [address]   [phone]   If at any time you feel your needs are more urgent or you are concerned for your well being please see the below options:  For walk in options for mental health:   [facility] [address] in [place], [place]. Call our 24-hour HelpLine at (209) [phone] or [phone] for immediate assistance for mental health and substance abuse issues.  And or walk into [facility] hospital ER.   [employer] Network: 1-800-SUICIDE  The [employer] Suicide  Prevention Lifeline: 1-800-273-TALK  Regards,   Eugenia Pancoast FNP-C

## 2022-10-29 NOTE — Assessment & Plan Note (Signed)
Repeat cbc pending results 

## 2022-10-29 NOTE — Assessment & Plan Note (Signed)
Work on low cholesterol diet and exercise as tolerated 

## 2022-10-29 NOTE — Assessment & Plan Note (Signed)
Stable. Checking A1c pending results.

## 2022-10-29 NOTE — Assessment & Plan Note (Signed)
Restart celexa 20 mg once daily  I instructed pt to start celexa 1/2 tablet once daily for 1 week and then increase to a full tablet once daily on week two as tolerated.  We discussed common side effects such as nausea, drowsiness and weight gain.  Also discussed rare but serious side effect of suicidal ideation.  She is instructed to discontinue medication and go directly to ED if this occurs.  Pt verbalizes understanding.  Plan is to follow up in 30 days to evaluate progress.

## 2022-10-29 NOTE — Progress Notes (Signed)
New Patient Office Visit  Subjective:  Patient ID: Carmen Reid, female    DOB: 03/24/1996  Age: 27 y.o. MRN: [id number]  CC:  Chief Complaint  Patient presents with   Establish Care    HPI [facility] is here to establish care as a new patient.  Oriented to practice routines and expectations.  Prior provider was: Webb Silversmith, [place], 2019   Pt is with acute concerns.   chronic concerns:  H/o Dvt, however later saw someone that states there was never a blood clot. Did have a reaction to warfarin so she d/c this as well. She states there is an issue on her left lower leg, and she states it swells on and off since about 9th grade. Denies any known trauma or injury to this side to her knowledge. She has had her heart checked, states veins have been checked all unremarkable. 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 She does state when she had  She states the pain is still ongoing, wearing compression socks at work as well. The swelling is typically at the base of the lower anterior foot. Does feel numbness and pain at times in this area as well.   Also does state note some swelling that happened about five days ago in her anterior wrist that is coming down slightly.  States was diagnosed in seventh grade with RA. Hasn't seen rheumatologist since.   Also states in the middle of the night will wake up with a weird smell, states the air smells bad. No known mold in the house. No one else in the house smells these things.   PCOS: on birth control, now only 2-3 days a month, doing well.   Pseudotumor cerebri: no h/o seizures, was seeing in the past however has been without insurance with job for a few years. She was seeing them annually, last seen in 2019. Was also seeing an eye doctor as they were checking the fluid behind the eyes. She states they were seeing fluid buildup behind eye in the past. Hasn't had a spinal tap since about 2018.    Anxiety depression: increased stress as of late. Having to use calming techniques a lot which are not always effective. Tried seeing a counselor in the past. Has taking celexa in the past, thinks she did well with this.         ROS: Negative unless specifically indicated above in HPI.   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  Constitutional:      General: She is not in acute distress.    Appearance: Normal appearance. She is normal weight. She is not ill-appearing.  HENT:     Head: Normocephalic.     Right Ear: Tympanic membrane normal.     Left Ear: Tympanic membrane normal.     Nose: Nose normal.     Mouth/Throat:     Mouth: Mucous membranes are moist.  Eyes:     Extraocular Movements: Extraocular movements intact.     Pupils: Pupils are equal, round, and reactive to light.  Cardiovascular:     Rate and Rhythm: Normal rate and regular rhythm.  Pulmonary:     Effort: Pulmonary effort is normal.     Breath sounds: Normal breath sounds.  Abdominal:     General: Abdomen is flat. Bowel sounds are normal.     Palpations: Abdomen is soft.     Tenderness: There is no guarding or  rebound.  Musculoskeletal:        General: Normal range of motion.     Cervical back: Normal range of motion.  Skin:    General: Skin is warm.     Capillary Refill: Capillary refill takes less than 2 seconds.  Neurological:     General: No focal deficit present.     Mental Status: She is alert.  Psychiatric:        Mood and Affect: Mood normal.        Behavior: Behavior normal.        Thought Content: Thought content normal.        Judgment: Judgment normal.     Assessment & Plan:  Pseudotumor cerebri Assessment & Plan: Overdue for neurology and ophthalmology f/u.  Referral placed for both to f/u  No recent headaches   Orders: -     Ambulatory referral to Neurology -     Ambulatory referral to Ophthalmology  Edema of left lower extremity Assessment & Plan: On anterior ankle, chronic.  Doing w/u for RF  Orders: -     Basic metabolic panel -     Sedimentation rate -     Rheumatoid factor -     ANA  Anemia, unspecified type Assessment & Plan: Repeat cbc pending results.   Orders: -     CBC with Differential/Platelet -     IBC + Ferritin  PCOS (polycystic ovarian syndrome) Assessment & Plan: Stable. Checking A1c pending results.   Orders: -     Hemoglobin A1c -     Basic metabolic panel  BMI [id number], adult (HCC) -     Hemoglobin A1c  Hyperlipidemia, unspecified hyperlipidemia type Assessment & Plan: Work on low cholesterol diet and exercise as tolerated   Orders: -     Lipid panel -     Basic metabolic panel  Anxiety and depression Assessment & Plan: Restart celexa 20 mg once daily  I instructed pt to start celexa 1/2 tablet once daily for 1 week and then increase to a full tablet once daily on week two as tolerated.  We discussed common side effects such as nausea, drowsiness and weight gain.  Also discussed rare but serious side effect of suicidal ideation.  She is instructed to discontinue medication and go directly to ED if this occurs.  Pt  verbalizes understanding.  Plan is to follow up in 30 days to evaluate progress.     Orders: -  Citalopram Hydrobromide; Take 1 tablet (20 mg total) by mouth daily.  Dispense: 90 tablet; Refill: 3  History of rheumatoid arthritis Assessment & Plan: With ongoing joint swelling and stiffness ordering ana rf sed rate pending results.  Will consider rheumatology referral due to h/o to get a full workup.   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   Eugenia Pancoast, FNP

## 2022-10-30 ENCOUNTER — Encounter: Payer: Self-pay | Admitting: Family

## 2022-10-30 ENCOUNTER — Encounter: Payer: Self-pay | Admitting: *Deleted

## 2022-10-30 LAB — RHEUMATOID FACTOR: Rheumatoid fact SerPl-aCnc: <14 IU/mL (ref <14)

## 2022-10-30 LAB — ANA: Anti Nuclear Antibody (ANA): NEGATIVE

## 2022-10-30 NOTE — Telephone Encounter (Signed)
Spoke with the patient regarding her labs.

## 2022-10-30 NOTE — Progress Notes (Signed)
Was pt fasting with these labs?  If yes these are much too high.  We need to work very strictly on low cholesterol diet, and also suggest we start something for lowering cholesterol, total is > 200 at 238. Triglycerides should be less than 150 and are 257. LDL is 157, goal less tan 100.   Sed rate is elevated which is an inflammatory marker, makes sense with swelling. Pending ANA to determine if possible autoimmune disease.

## 2022-10-31 ENCOUNTER — Other Ambulatory Visit: Payer: Self-pay | Admitting: Family

## 2022-10-31 DIAGNOSIS — M2550 Pain in unspecified joint: Secondary | ICD-10-CM

## 2022-10-31 DIAGNOSIS — Z8739 Personal history of other diseases of the musculoskeletal system and connective tissue: Secondary | ICD-10-CM

## 2022-10-31 DIAGNOSIS — R600 Localized edema: Secondary | ICD-10-CM

## 2022-11-04 ENCOUNTER — Telehealth: Payer: Self-pay | Admitting: Family

## 2022-11-04 NOTE — Telephone Encounter (Signed)
Patient would like to know if her referral to neurology can be sent to another location?The location where it was originally sent will not see her because she has an outstanding balance with them.

## 2022-11-04 NOTE — Telephone Encounter (Signed)
Will verify if an alternative is available.

## 2022-11-05 NOTE — Telephone Encounter (Signed)
I have sent this one to referral team to answer/change.

## 2022-11-14 ENCOUNTER — Emergency Department: Payer: Medicaid Other

## 2022-11-14 ENCOUNTER — Telehealth: Payer: Self-pay | Admitting: Family

## 2022-11-14 ENCOUNTER — Emergency Department
Admission: EM | Admit: 2022-11-14 | Discharge: 2022-11-15 | Disposition: A | Discharge status: Home / Self Care | Payer: Medicaid Other | Attending: Student in an Organized Health Care Education/Training Program | Admitting: Student in an Organized Health Care Education/Training Program

## 2022-11-14 ENCOUNTER — Other Ambulatory Visit: Payer: Self-pay

## 2022-11-14 DIAGNOSIS — I159 Secondary hypertension, unspecified: Secondary | ICD-10-CM

## 2022-11-14 DIAGNOSIS — R079 Chest pain, unspecified: Secondary | ICD-10-CM | POA: Diagnosis present

## 2022-11-14 DIAGNOSIS — R519 Headache, unspecified: Secondary | ICD-10-CM | POA: Diagnosis not present

## 2022-11-14 DIAGNOSIS — I10 Essential (primary) hypertension: Secondary | ICD-10-CM | POA: Diagnosis not present

## 2022-11-14 DIAGNOSIS — R0789 Other chest pain: Secondary | ICD-10-CM

## 2022-11-14 HISTORY — DX: Unspecified pre-existing hypertension complicating pregnancy, unspecified trimester: O10.919

## 2022-11-14 LAB — CBC
HCT: 43.5 % (ref 36.0–46.0)
Hemoglobin: 14.7 g/dL (ref 12.0–15.0)
MCH: 29.3 pg (ref 26.0–34.0)
MCHC: 33.8 g/dL (ref 30.0–36.0)
MCV: 86.7 fL (ref 80.0–100.0)
Platelets: 330 10*3/uL (ref 150–400)
RBC: 5.02 MIL/uL (ref 3.87–5.11)
RDW: 12.7 % (ref 11.5–15.5)
WBC: 12.2 10*3/uL — ABNORMAL HIGH (ref 4.0–10.5)
nRBC: 0 % (ref 0.0–0.2)

## 2022-11-14 LAB — BASIC METABOLIC PANEL
Anion gap: 12 (ref 5–15)
BUN: 13 mg/dL (ref 6–20)
CO2: 24 mmol/L (ref 22–32)
Calcium: 9.6 mg/dL (ref 8.9–10.3)
Chloride: 101 mmol/L (ref 98–111)
Creatinine, Ser: 0.83 mg/dL (ref 0.44–1.00)
GFR, Estimated: >60 mL/min (ref >60)
Glucose, Bld: 96 mg/dL (ref 70–99)
Potassium: 4.1 mmol/L (ref 3.5–5.1)
Sodium: 137 mmol/L (ref 135–145)

## 2022-11-14 LAB — TROPONIN I (HIGH SENSITIVITY)
Troponin I (High Sensitivity): <2 ng/L (ref <18)
Troponin I (High Sensitivity): <2 ng/L (ref <18)

## 2022-11-14 LAB — POC URINE PREG, ED: Preg Test, Ur: NEGATIVE

## 2022-11-14 MED ORDER — AMLODIPINE BESYLATE 5 MG PO TABS
5.0000 mg | ORAL_TABLET | Freq: Once | ORAL | Status: AC
  Administered 2022-11-14: 5 mg via ORAL
  Filled 2022-11-14: qty 1

## 2022-11-14 MED ORDER — AMLODIPINE BESYLATE 5 MG PO TABS: 5.0000 mg | ORAL_TABLET | Freq: Every day | ORAL | 0 refills | Status: DC

## 2022-11-14 NOTE — Telephone Encounter (Signed)
[place] [facility] - Client TELEPHONE ADVICE RECORD [facility] Patient Name: Carmen Reid Gender: Female DOB: 02-16-1996 Age: 27 Y 1 M 15 D Return Phone Number: [phone] (Primary) Address: [place]  [address] Client [facility] - Client Client Site [place] - Day Provider AA - PHYSICIAN, NOT LISTED- MD Contact Type Call Who Is Calling Patient / Member / Family / Caregiver Call Type Triage / Clinical Relationship To Patient Self Return Phone Number [phone] (Primary) Chief Complaint BLOOD PRESSURE HIGH - Systolic (top number) [id number] or greater Reason for Call Symptomatic / Request for [place] states her blood pressure is high, highest reading was 200/106. She has been really shaky. Sees Eugenia Pancoast, NP. Translation No Nurse Assessment Nurse: Frederico Hamman, RN, Thurman Coyer Date/Time (Eastern Time): 11/14/2022 3:54:06 PM Confirm and document reason for call. If symptomatic, describe symptoms. ---Caller states her blood pressure is high, highest reading was 200/106. She has been really shaky. Sees Eugenia Pancoast, NP. She had HTN when she was pregnant but has not had issues since she had her baby in August. Does the patient have any new or worsening symptoms? ---Yes Will a triage be completed? ---Yes Related visit to physician within the last 2 weeks? ---No Does the PT have any chronic conditions? (i.e. diabetes, asthma, this includes High risk factors for pregnancy, etc.) ---Yes List chronic conditions. ---High cholesterol Is the patient pregnant or possibly pregnant? (Ask all females between the ages of 74-55) ---No Is this a behavioral health or substance abuse call? ---No Guidelines Guideline Title Affirmed Question Affirmed Notes Nurse Date/Time (Eastern Time) Blood Pressure - High [id number] Systolic BP >= [id number] OR Diastolic >= 123456 AND  A999333 having NO cardiac or neurologic symptoms Cleotis Nipper 11/14/2022 3:55:24 PM PLEASE NOTE: All timestamps contained within this report are represented as [place] Standard Time. CONFIDENTIALTY NOTICE: This fax transmission is intended only for the addressee. It contains information that is legally privileged, confidential or otherwise protected from use or disclosure. If you are not the intended recipient, you are strictly prohibited from reviewing, disclosing, copying using or disseminating any of this information or taking any action in reliance on or regarding this information. If you have received this fax in error, please notify us immediately by telephone so that we can arrange for its return to [place]. Phone: [phone], Toll-Free: [phone], Fax: [phone] Page: 2 of 2 Call Id: [id number] [place]. Time Eilene Ghazi Time) Disposition Final User 11/14/2022 3:52:39 PM Send to Urgent Jeannette Corpus, Tiffany 11/14/2022 4:04:21 PM See HCP within 4 Hours (or PCP triage) Yes Frederico Hamman, RN, Gabriella 11/14/2022 4:10:08 PM [place], RN, Thurman Coyer Reason: No answer at 911 f/u call Final Disposition 11/14/2022 4:04:21 PM See HCP within 4 Hours (or PCP triage) Cornell Barman, RN, [facility] Advice Given Per Guideline SEE HCP (OR PCP TRIAGE) WITHIN 4 HOURS: * IF OFFICE WILL BE CLOSED AND NO PCP (PRIMARY CARE PROVIDER) SECOND-LEVEL TRIAGE: You need to be seen within the next 3 or 4 hours. A nearby Urgent Care Center [facility]) is often a good source of care. Another choice is to go to the ED. Go sooner if you become worse. Comments User: Earlie Lou, RN Date/Time Eilene Ghazi Time): 11/14/2022 4:08:20 PM After attempting to schedule appt, I got back on the line with the caller who stated she could not see and was calling 911

## 2022-11-14 NOTE — ED Provider Notes (Signed)
[facility] Provider Note    Event Date/Time   First MD Initiated Contact with Patient 11/14/22 2226     (approximate)   History   Chest Pain and Hypertension   HPI  Nakiesha [address] [name] is a 27 y.o. female with a history of gestational hypertension as well as significant family history of hypertension presents to the ER due to concern for some chest pain as well as blurry vision and an elevated blood pressure that occurred earlier today.  Patient states that she did feel like she had a panic earlier in the morning but that resolved.  This afternoon started feeling the symptoms come on fairly abruptly.  States that she is not currently on any antihypertensive medications but her blood pressure has been running high for several days to weeks now.  No new medications.  She is being worked up for possible pseudotumor.  She denies any headache or blurry vision right now.  Chest pain has resolved.     Physical Exam   Triage Vital Signs: ED Triage Vitals  Enc Vitals Group     BP 11/14/22 1929 (!) 183/116     Pulse Rate 11/14/22 1929 (!) 121     Resp 11/14/22 1929 20     Temp 11/14/22 1931 98.1 F (36.7 C)     Temp Source 11/14/22 1929 Oral     SpO2 11/14/22 1929 100 %     Weight 11/14/22 1927 235 lb (106.6 kg)     Height 11/14/22 1927 5\' 4"  (1.626 m)     Head Circumference --      Peak Flow --      Pain Score 11/14/22 1927 3     Pain Loc --      Pain Edu? --      Excl. in [place]? --     Most recent vital signs: Vitals:   11/14/22 1931 11/14/22 2314  BP:  (!) 147/107  Pulse:  77  Resp:  18  Temp: 98.1 F (36.7 C)   SpO2:  97%     Constitutional: Alert  Eyes: Conjunctivae are normal.  Head: Atraumatic. Nose: No congestion/rhinnorhea. Mouth/Throat: Mucous membranes are moist.   Neck: Painless ROM.  Cardiovascular:   Good peripheral circulation. Respiratory: Normal respiratory effort.  No retractions.  Gastrointestinal: Soft and  nontender.  Musculoskeletal:  no deformity Neurologic:  MAE spontaneously. No gross focal neurologic deficits are appreciated.  Skin:  Skin is warm, dry and intact. No rash noted. Psychiatric: Mood and affect are normal. Speech and behavior are normal.    ED Results / Procedures / Treatments   Labs (all labs ordered are listed, but only abnormal results are displayed) Labs Reviewed  CBC - Abnormal; Notable for the following components:      Result Value   WBC 12.2 (*)    All other components within normal limits  BASIC METABOLIC PANEL  POC URINE PREG, ED  TROPONIN I (HIGH SENSITIVITY)  TROPONIN I (HIGH SENSITIVITY)     EKG  ED ECG REPORT I, Merlyn Lot, the attending physician, personally viewed and interpreted this ECG.   Date: 11/15/2022  EKG Time: 19:29  Rate: 110  Rhythm: sinus  Axis: normal  Intervals: normal  ST&T Change: no stemi, no depressions    RADIOLOGY Please see ED Course for my review and interpretation.  I personally reviewed all radiographic images ordered to evaluate for the above acute complaints and reviewed radiology reports and findings.  These findings  were personally discussed with the patient.  Please see medical record for radiology report.    PROCEDURES:  Critical Care performed: No  Procedures   MEDICATIONS ORDERED IN ED: Medications  amLODipine (NORVASC) tablet 5 mg (5 mg Oral Given 11/14/22 2314)     IMPRESSION / MDM / ASSESSMENT AND PLAN / ED COURSE  I reviewed the triage vital signs and the nursing notes.                              Differential diagnosis includes, but is not limited to, hypertension, ACS, stress, hypertensive urgency, CVA, pseudotumor  Patient presenting to the ER for evaluation of symptoms as described above.  Based on symptoms, risk factors and considered above differential, this presenting complaint could reflect a potentially life-threatening illness therefore the patient will be placed on  continuous pulse oximetry and telemetry for monitoring.  Laboratory evaluation will be sent to evaluate for the above complaints.  Patient arrives clinically very well-appearing no focal neurodeficits.  EKG is nonischemic, mild tachycardia but no chest pain or shortness of breath at this time very low suspicion for PE or dissection.  No hypoxia.  Low suspicion for hypertensive emergency but she did have quite elevated blood pressure prehospital.  Will give Norvasc.  Chest x-ray without evidence of cardiomegaly or edema no pneumothorax.  Patient states that she feels quite well right now, but we discussed her some of her symptoms of blurry vision and states she has not had any recent neuroimaging given the elevated blood pressure we will proceed with CT head.   Clinical Course as of 11/15/22 0004  Thu Nov 14, 2022  2353 CT head on my review interpretation does not show any evidence of acute abnormality.  Given patient's well appearance I do believe she stable and appropriate for outpatient follow-up. [PR]    Clinical Course User Index [PR] Merlyn Lot, MD     FINAL CLINICAL IMPRESSION(S) / ED DIAGNOSES   Final diagnoses:  Secondary hypertension  Atypical chest pain     Rx / DC Orders   ED Discharge Orders          Ordered    amLODipine (NORVASC) 5 MG tablet  Daily        11/14/22 2335             Note:  This document was prepared using Dragon voice recognition software and may include unintentional dictation errors.    Merlyn Lot, MD 11/15/22 [phone]

## 2022-11-14 NOTE — Telephone Encounter (Signed)
Pt called and said EMS came out and said to see doctor tomorrow; pt said her sight became blurry and sight was going in and out for a short period BP with EMS was 240/120. I advised pt our office will be closed 11/15/22 for [employer]. I advised pt she should be seen now if BP was 240/120. Pt asked if our office could call in a BP med. I advised pt if she was on BP med the provider might be able to adjust med but the safest and most appropriate thing is for pt to go to ED now. Pt said her mother would take her to ED now. Sending note to Dr Damita Dunnings who is in office and Red Christians FNP who is out of office.

## 2022-11-14 NOTE — Telephone Encounter (Signed)
Unable to reach pt or pts mother (DPR signed) but per end of access note pt could not see and pt was calling 911. Sending note to Red Christians FNP who is out of the office and Dr Damita Dunnings who is still in office and Damita Dunnings pool and will speak with Janett Billow CMA.  Since I still cannot speak with pt or her mother I called [employer] and the dispatcher said that a sick call had been sent to pts address so that verified that pt was getting assistance.

## 2022-11-14 NOTE — Telephone Encounter (Signed)
Agree with ER.  Thanks.  

## 2022-11-14 NOTE — Telephone Encounter (Signed)
Appreciate the quick direction for pt to call 911. Agree with your recommendations. I do see patient has arrived at the ER

## 2022-11-14 NOTE — ED Triage Notes (Signed)
Pt reports HTN during pregnancy and gave birth 7 months ago. Pt reports blurred vision and limited peripheral vision with h/a and nausea and chest tightness. This prompted pt to call EMS and bp found to be 240/120. Pt then presented to ER. Pt continues to report chest tightness, h/a, dizziness, and nausea. Pt is ambulatory to triage. Breathing unlabored speaking in full sentences.

## 2022-11-14 NOTE — Telephone Encounter (Signed)
Patient called in and stated that her blood pressure has been running high all day. She stated that the highest has been 200/106 and the last one she took was 178/98. She stated that she was shaky and feeling hollow inside. Sent over to access nurse.

## 2022-11-15 NOTE — ED Provider Notes (Incomplete)
[facility] Provider Note    Event Date/Time   First MD Initiated Contact with Patient 11/14/22 2226     (approximate)   History   Chest Pain and Hypertension   HPI  Carmen Reid [address] Carmen Reid is a 27 y.o. female with a history of gestational hypertension as well as significant family history of hypertension presents to the ER due to concern for some chest pain as well as blurry vision and an elevated blood pressure that occurred earlier today.  Patient states that she did feel like she had a panic earlier in the morning but that resolved.  This afternoon started feeling the symptoms come on fairly abruptly.  States that she is not currently on any antihypertensive medications but her blood pressure has been running high for several days to weeks now.  No new medications.  She is being worked up for possible pseudotumor.  She denies any headache or blurry vision right now.  Chest pain has resolved.     Physical Exam   Triage Vital Signs: ED Triage Vitals  Enc Vitals Group     BP 11/14/22 1929 (!) 183/116     Pulse Rate 11/14/22 1929 (!) 121     Resp 11/14/22 1929 20     Temp 11/14/22 1931 98.1 F (36.7 C)     Temp Source 11/14/22 1929 Oral     SpO2 11/14/22 1929 100 %     Weight 11/14/22 1927 235 lb (106.6 kg)     Height 11/14/22 1927 5\' 4"  (1.626 m)     Head Circumference --      Peak Flow --      Pain Score 11/14/22 1927 3     Pain Loc --      Pain Edu? --      Excl. in [place]? --     Most recent vital signs: Vitals:   11/14/22 1931 11/14/22 2314  BP:  (!) 147/107  Pulse:  77  Resp:  18  Temp: 98.1 F (36.7 C)   SpO2:  97%     Constitutional: Alert  Eyes: Conjunctivae are normal.  Head: Atraumatic. Nose: No congestion/rhinnorhea. Mouth/Throat: Mucous membranes are moist.   Neck: Painless ROM.  Cardiovascular:   Good peripheral circulation. Respiratory: Normal respiratory effort.  No retractions.  Gastrointestinal: Soft and  nontender.  Musculoskeletal:  no deformity Neurologic:  MAE spontaneously. No gross focal neurologic deficits are appreciated.  Skin:  Skin is warm, dry and intact. No rash noted. Psychiatric: Mood and affect are normal. Speech and behavior are normal.    ED Results / Procedures / Treatments   Labs (all labs ordered are listed, but only abnormal results are displayed) Labs Reviewed  CBC - Abnormal; Notable for the following components:      Result Value   WBC 12.2 (*)    All other components within normal limits  BASIC METABOLIC PANEL  POC URINE PREG, ED  TROPONIN I (HIGH SENSITIVITY)  TROPONIN I (HIGH SENSITIVITY)     EKG  ED ECG REPORT I, Merlyn Lot, the attending physician, personally viewed and interpreted this ECG.   Date: 11/15/2022  EKG Time: 19:29  Rate: 110  Rhythm: sinus  Axis: normal  Intervals: normal  ST&T Change: no stemi, no depressions    RADIOLOGY Please see ED Course for my review and interpretation.  I personally reviewed all radiographic images ordered to evaluate for the above acute complaints and reviewed radiology reports and findings.  These findings  were personally discussed with the patient.  Please see medical record for radiology report.    PROCEDURES:  Critical Care performed: No  Procedures   MEDICATIONS ORDERED IN ED: Medications  amLODipine (NORVASC) tablet 5 mg (5 mg Oral Given 11/14/22 2314)     IMPRESSION / MDM / ASSESSMENT AND PLAN / ED COURSE  I reviewed the triage vital signs and the nursing notes.                              Differential diagnosis includes, but is not limited to, hypertension, ACS, stress, hypertensive urgency, CVA, pseudotumor  Patient presenting to the ER for evaluation of symptoms as described above.  Based on symptoms, risk factors and considered above differential, this presenting complaint could reflect a potentially life-threatening illness therefore the patient will be placed on  continuous pulse oximetry and telemetry for monitoring.  Laboratory evaluation will be sent to evaluate for the above complaints.  Patient arrives clinically very well-appearing no focal neurodeficits.  EKG is nonischemic, mild tachycardia but no chest pain or shortness of breath at this time very low suspicion for PE or dissection.  No hypoxia.  Low suspicion for hypertensive emergency but she did have quite elevated blood pressure prehospital.  Will give Norvasc.  Chest x-ray without evidence of cardiomegaly or edema no pneumothorax.  Patient states that she feels quite well right now, but we discussed her some of her symptoms of blurry vision and states she has not had any recent neuroimaging given the elevated blood pressure we will proceed with CT head.   Clinical Course as of 11/15/22 0004  Thu Nov 14, 2022  2353 CT head on my review interpretation does not show any evidence of acute abnormality.  Given patient's well appearance I do believe she stable and appropriate for outpatient follow-up. [PR]    Clinical Course User Index [PR] Merlyn Lot, MD     FINAL CLINICAL IMPRESSION(S) / ED DIAGNOSES   Final diagnoses:  Secondary hypertension     Rx / DC Orders   ED Discharge Orders          Ordered    amLODipine (NORVASC) 5 MG tablet  Daily        11/14/22 2335             Note:  This document was prepared using Dragon voice recognition software and may include unintentional dictation errors.

## 2022-11-18 ENCOUNTER — Telehealth: Payer: Self-pay

## 2022-11-18 NOTE — Transitions of Care (Post Inpatient/ED Visit) (Signed)
   11/18/2022  Name: Brittyn Klecha MRN: [id number] DOB: [birth year]  Today's TOC FU Call Status: Today's TOC FU Call Status:: Successful TOC FU Call Competed TOC FU Call Complete Date: 11/18/22  Transition Care Management Follow-up Telephone Call Date of Discharge: 11/15/22 Discharge Facility: [facility]) Type of Discharge: Emergency Department Reason for ED Visit: Cardiac Conditions Cardiac Conditions Diagnosis: Chest Pain Persisting (secondary HTN) How have you been since you were released from the hospital?: Better Any questions or concerns?: No  Items Reviewed: Did you receive and understand the discharge instructions provided?: Yes Medications obtained and verified?: Yes (Medications Reviewed) Any new allergies since your discharge?: No Dietary orders reviewed?: NA Do you have support at home?: Yes People in Home: parent(s)  Home Care and Equipment/Supplies: [place] Ordered?: NA Any new equipment or medical supplies ordered?: NA  Functional Questionnaire: Do you need assistance with bathing/showering or dressing?: No Do you need assistance with meal preparation?: No Do you need assistance with eating?: No Do you have difficulty maintaining continence: No Do you need assistance with getting out of bed/getting out of a chair/moving?: No Do you have difficulty managing or taking your medications?: No  Follow up appointments reviewed: PCP Follow-up appointment confirmed?: Yes Date of PCP follow-up appointment?: 11/25/22 Follow-up Provider: Eugenia Pancoast [facility] Follow-up appointment confirmed?: NA Do you need transportation to your follow-up appointment?: No Do you understand care options if your condition(s) worsen?: Yes-patient verbalized understanding    [place] Team Direct dial:  [phone]

## 2022-11-25 ENCOUNTER — Ambulatory Visit (INDEPENDENT_AMBULATORY_CARE_PROVIDER_SITE_OTHER): Payer: Medicaid Other | Admitting: Family

## 2022-11-25 ENCOUNTER — Encounter: Payer: Self-pay | Admitting: *Deleted

## 2022-11-25 ENCOUNTER — Encounter: Payer: Self-pay | Admitting: Family

## 2022-11-25 VITALS — BP 142/98 | HR 86 | Temp 97.9°F | Ht 64.0 in | Wt 237.4 lb

## 2022-11-25 DIAGNOSIS — J338 Other polyp of sinus: Secondary | ICD-10-CM

## 2022-11-25 DIAGNOSIS — I158 Other secondary hypertension: Secondary | ICD-10-CM | POA: Diagnosis not present

## 2022-11-25 DIAGNOSIS — E236 Other disorders of pituitary gland: Secondary | ICD-10-CM | POA: Diagnosis not present

## 2022-11-25 DIAGNOSIS — G932 Benign intracranial hypertension: Secondary | ICD-10-CM | POA: Diagnosis not present

## 2022-11-25 MED ORDER — AMLODIPINE BESYLATE 10 MG PO TABS: 10.0000 mg | ORAL_TABLET | Freq: Every day | ORAL | 0 refills | Status: DC

## 2022-11-25 NOTE — Patient Instructions (Signed)
A referral was placed today to ENT, endocrinology and also neurology.  Please let us know if you have not heard back within 2 weeks about the referral.   Regards,   Jacinto Keil FNP-C

## 2022-11-25 NOTE — Progress Notes (Unsigned)
Established Patient Hospital follow up Visit Subjective:   Patient ID: Carmen Reid, female    DOB: 1996-02-02  Age: 27 y.o. MRN: [id number]  CC:  Chief Complaint  Patient presents with   Hospitalization Follow-up    Hypertension        HPI  Carmen Reid is a 27 y.o. female presenting on 11/25/2022 for  hospital follow up.    Hospital: [facility]  Admit: 3/28  Discharge: 3/29 Discharge dx: secondary hypertension  Discharge medications: norvasc 5 mg once daily   Went to ER for chest pain blurry vision and elevated blood pressure. She did also note she had a panic attack earlier. In ER bp 183/116 pulse 121  EKG mild tachy , nonischemic Was given norvasc 5 mg and is still taking once daily.  Cxr without acute findings  CT head 11/14/22, no acute findings  Acute concerns:  Today in office blood pressure 142/98 Pt states at home average 150/95-100 or so.  No longer with cp blurry vision.   Dx with intracranial hypertension by ophthalmologist back in 2017. Was on medication in the past, doesn't remember name, however needs to be back with neurologist to approve medication per ophthalmologist.     Allergies  Allergen Reactions   Latex Swelling   Topiramate Other (See Comments)    Head swelling   Warfarin And Related Dermatitis   ----------------   Social history:  Relevant past medical, surgical, family and social history reviewed and updated as indicated. Interim medical history since our last visit reviewed.  Allergies and medications reviewed and updated.  DATA REVIEWED: CHART IN EPIC    ROS: Negative unless specifically indicated above in HPI.    Current Outpatient Medications:    amLODipine (NORVASC) 10 MG tablet, Take 1 tablet (10 mg total) by mouth daily., Disp: 90 tablet, Rfl: 0   citalopram (CELEXA) 20 MG tablet, Take 1 tablet (20 mg total) by mouth daily., Disp: 90 tablet, Rfl: 3   levonorgestrel (MIRENA) 20 MCG/DAY IUD, 1  each by Intrauterine route once., Disp: , Rfl:       Objective:    BP (!) 142/98 (BP Location: Left Arm, Patient Position: Sitting)   Pulse 86   Temp 97.9 F (36.6 C) (Temporal)   Ht 5\' 4"  (1.626 m)   Wt 237 lb 6.4 oz (107.7 kg)   LMP 10/02/2022   SpO2 98%   BMI 40.75 kg/m   Wt Readings from Last 3 Encounters:  11/25/22 237 lb 6.4 oz (107.7 kg)  11/14/22 235 lb (106.6 kg)  10/29/22 241 lb 9.6 oz (109.6 kg)    Physical Exam Constitutional:      General: She is not in acute distress.    Appearance: Normal appearance. She is normal weight. She is not ill-appearing, toxic-appearing or diaphoretic.  HENT:     Head: Normocephalic.  Cardiovascular:     Rate and Rhythm: Normal rate and regular rhythm.  Pulmonary:     Effort: Pulmonary effort is normal.  Musculoskeletal:        General: Normal range of motion.  Neurological:     General: No focal deficit present.     Mental Status: She is alert and oriented to person, place, and time. Mental status is at baseline.  Psychiatric:        Mood and Affect: Mood normal.        Behavior: Behavior normal.        Thought Content: Thought content normal.  Judgment: Judgment normal.         Assessment & Plan:  Empty sella Assessment & Plan: Partially empty cella seen on recent CT in hospital.  Referral placed for endocrinology as pt also with abn menstrual cycle Did d/w pt in detail   Orders: -     Ambulatory referral to Endocrinology  Maxillary polyp of sinus -     Ambulatory referral to ENT  Idiopathic intracranial hypertension -     Ambulatory referral to Neurology  Pseudotumor cerebri Assessment & Plan: Overdue for neurology and ophthalmology f/u.  Referral placed for both to f/u  No recent headaches    Other secondary hypertension Assessment & Plan: Pt advised of the following:  Continue medication as prescribed. Monitor blood pressure periodically and/or when you feel symptomatic. Goal is <130/90 on  average. Ensure that you have rested for 30 minutes prior to checking your blood pressure. Record your readings and bring them to your next visit if necessary.work on a low sodium diet. Increase amlodipine to 10 mg once daily.   Other orders -     amLODIPine Besylate; Take 1 tablet (10 mg total) by mouth daily.  Dispense: 90 tablet; Refill: 0     Return in about 1 month (around 12/25/2022) for f/u blood pressure.  Mort Sawyers, FNP

## 2022-11-27 DIAGNOSIS — I158 Other secondary hypertension: Secondary | ICD-10-CM | POA: Insufficient documentation

## 2022-11-27 NOTE — Assessment & Plan Note (Signed)
Pt advised of the following:  Continue medication as prescribed. Monitor blood pressure periodically and/or when you feel symptomatic. Goal is <130/90 on average. Ensure that you have rested for 30 minutes prior to checking your blood pressure. Record your readings and bring them to your next visit if necessary.work on a low sodium diet. Increase amlodipine to 10 mg once daily.

## 2022-11-27 NOTE — Assessment & Plan Note (Signed)
Overdue for neurology and ophthalmology f/u.  Referral placed for both to f/u  No recent headaches  

## 2022-11-27 NOTE — Assessment & Plan Note (Signed)
Partially empty cella seen on recent CT in hospital.  Referral placed for endocrinology as pt also with abn menstrual cycle Did d/w pt in detail

## 2022-12-01 ENCOUNTER — Other Ambulatory Visit: Payer: Self-pay | Admitting: Family

## 2022-12-03 ENCOUNTER — Telehealth: Payer: Self-pay | Admitting: Family

## 2022-12-03 NOTE — Telephone Encounter (Signed)
Duplicate. See Message from 12/01/22. Sent to Tabitha to send in medication.

## 2022-12-03 NOTE — Telephone Encounter (Signed)
Pt called in stating the meds, amLODipine (NORVASC) 10 MG tablet, we suppose to be sent in by [facility] after last ov with her. Told pt the meds were sent in on 4/8, pt states pharmacy told her, they never received any prescriptions. Pt stated the dosage was also suppose to be increased from  to . Call back # [phone]

## 2022-12-04 MED ORDER — AMLODIPINE BESYLATE 10 MG PO TABS: 10.0000 mg | ORAL_TABLET | Freq: Every day | ORAL | 3 refills | Status: DC

## 2022-12-30 ENCOUNTER — Encounter: Payer: Self-pay | Admitting: Family

## 2022-12-30 ENCOUNTER — Ambulatory Visit: Payer: Medicaid Other | Admitting: Family

## 2022-12-30 VITALS — BP 120/80 | HR 97 | Temp 97.7°F | Ht 64.0 in | Wt 238.2 lb

## 2022-12-30 DIAGNOSIS — E66813 Obesity, class 3: Secondary | ICD-10-CM | POA: Insufficient documentation

## 2022-12-30 DIAGNOSIS — G932 Benign intracranial hypertension: Secondary | ICD-10-CM | POA: Diagnosis not present

## 2022-12-30 DIAGNOSIS — R635 Abnormal weight gain: Secondary | ICD-10-CM | POA: Diagnosis not present

## 2022-12-30 DIAGNOSIS — E785 Hyperlipidemia, unspecified: Secondary | ICD-10-CM

## 2022-12-30 DIAGNOSIS — Z6841 Body Mass Index (BMI) 40.0 and over, adult: Secondary | ICD-10-CM | POA: Diagnosis not present

## 2022-12-30 LAB — TSH: TSH: 3 u[IU]/mL (ref 0.35–5.50)

## 2022-12-30 LAB — T4, FREE: Free T4: 0.64 ng/dL (ref 0.60–1.60)

## 2022-12-30 NOTE — Assessment & Plan Note (Signed)
Pt advised to work on diet and exercise as tolerated Will consider GLP 1 pending pt response if any known h/o thyroid cancer

## 2022-12-30 NOTE — Assessment & Plan Note (Signed)
Has since consulted with neurologist.  Pt to continue f/u with neurology as scheduled.  Pt to consider lumbar puncture as per neurology request. Will continue to monitor blood pressure for control.

## 2022-12-30 NOTE — Patient Instructions (Addendum)
  Ask about possible coverage for the following:   If you find out you are diabetic:  [facility]  Ozempic  Trulicity   If not diabetic:  Wegovy  Zepbound Saxenda    Regards,   Mort Sawyers FNP-C

## 2022-12-30 NOTE — Assessment & Plan Note (Signed)
Tsh ordered pending results.  To r/o thyroid issues in relation to weight gain

## 2022-12-30 NOTE — Assessment & Plan Note (Signed)
Advised to continue with low cholesterol diet

## 2022-12-30 NOTE — Progress Notes (Signed)
Established Patient Office Visit  Subjective:      CC:  Chief Complaint  Patient presents with   Medical Management of Chronic Issues    HPI: Carmen Reid is a 27 y.o. female presenting on 12/30/2022 for Medical Management of Chronic Issues . HTN: last visit 4/8 increased to amlodipine 10 mg once daily.  Today bp 122/72.  At home blood pressure average around 120/90.  Denies pedal edema.   Empty sella and abn menses, referral was placed for endo. She states has an appointment coming up within the next one month.   neurologist: Dr. Malvin Johns, evaluated 5/6 they suggested lumbar puncture and started topamax 25 mg twice daily. Advised to keep close f/u with ophthalmologist.   No known h/o thyroid cancer in the family. Pt inquiring about glp 1 Obesity: goes on walks and at times going to farmers market.  Diet: was working on low cholesterol diet and was trying low carb cut out starchy carbs and cut out soda. She is frustrated because she is gaining weight.  Not counting her calories.   Curious because she does get redness bil knees, feels hot when this occurs. Worse with heat. She states does not feel that they are with arthritis necessary, no stiffness noted. She does have a h/o RA. Does have rheumatology appt in August 2024 for up to date eval.         Social history:  Relevant past medical, surgical, family and social history reviewed and updated as indicated. Interim medical history since our last visit reviewed.  Allergies and medications reviewed and updated.  DATA REVIEWED: CHART IN EPIC     ROS: Negative unless specifically indicated above in HPI.    Current Outpatient Medications:    amLODipine (NORVASC) 10 MG tablet, Take 1 tablet (10 mg total) by mouth daily., Disp: 90 tablet, Rfl: 3   citalopram (CELEXA) 20 MG tablet, Take 1 tablet (20 mg total) by mouth daily., Disp: 90 tablet, Rfl: 3   levonorgestrel (MIRENA) 20 MCG/DAY IUD, 1 each by  Intrauterine route once., Disp: , Rfl:    topiramate (TOPAMAX) 25 MG tablet, Take 1 tablet by mouth 2 (two) times daily., Disp: , Rfl:       Objective:    BP 120/80   Pulse 97   Temp 97.7 F (36.5 C) (Temporal)   Ht 5\' 4"  (1.626 m)   Wt 238 lb 3.2 oz (108 kg)   SpO2 99%   BMI 40.89 kg/m   Wt Readings from Last 3 Encounters:  12/30/22 238 lb 3.2 oz (108 kg)  11/25/22 237 lb 6.4 oz (107.7 kg)  11/14/22 235 lb (106.6 kg)    Physical Exam  Physical Exam Constitutional:      General: not in acute distress.    Appearance: Normal appearance. normal weight. is not ill-appearing, toxic-appearing or diaphoretic.  Cardiovascular:     Rate and Rhythm: Normal rate.  Pulmonary:     Effort: Pulmonary effort is normal.  Musculoskeletal:        General: Normal range of motion.  Neurological:     General: No focal deficit present.     Mental Status: alert and oriented to person, place, and time. Mental status is at baseline.  Psychiatric:        Mood and Affect: Mood normal.        Behavior: Behavior normal.        Thought Content: Thought content normal.  Judgment: Judgment normal.        Assessment & Plan:  Abnormal weight gain Assessment & Plan: Tsh ordered pending results.  To r/o thyroid issues in relation to weight gain  Orders: -     TSH -     T4, free  Idiopathic intracranial hypertension Assessment & Plan: Has since consulted with neurologist.  Pt to continue f/u with neurology as scheduled.  Pt to consider lumbar puncture as per neurology request. Will continue to monitor blood pressure for control.     Hyperlipidemia, unspecified hyperlipidemia type Assessment & Plan: Advised to continue with low cholesterol diet    Class 3 severe obesity due to excess calories without serious comorbidity with body mass index (BMI) of 40.0 to 44.9 in adult [facility]) Assessment & Plan: Pt advised to work on diet and exercise as tolerated Will consider GLP 1 pending pt  response if any known h/o thyroid cancer       Return in about 6 months (around 07/02/2023) for f/u CPE.  Mort Sawyers, MSN, APRN, FNP-C [facility]

## 2022-12-31 ENCOUNTER — Other Ambulatory Visit: Payer: Self-pay | Admitting: Family

## 2022-12-31 MED ORDER — WEGOVY 0.5 MG/0.5ML ~~LOC~~ SOAJ: SUBCUTANEOUS | 2 refills | Status: DC

## 2022-12-31 MED ORDER — WEGOVY 0.25 MG/0.5ML ~~LOC~~ SOAJ: SUBCUTANEOUS | 0 refills | Status: DC

## 2022-12-31 NOTE — Addendum Note (Signed)
Addended by: Mort Sawyers on: 12/31/2022 02:37 PM   Modules accepted: Orders

## 2022-12-31 NOTE — Telephone Encounter (Signed)
Please start prior auth for [employer]

## 2023-01-01 NOTE — Telephone Encounter (Signed)
Can we work on prior [place] for [employer]?

## 2023-01-02 ENCOUNTER — Other Ambulatory Visit (HOSPITAL_COMMUNITY): Payer: Self-pay

## 2023-01-02 NOTE — Telephone Encounter (Signed)
FYI to Tabitha. 

## 2023-01-07 ENCOUNTER — Other Ambulatory Visit (HOSPITAL_COMMUNITY): Payer: Self-pay

## 2023-01-07 NOTE — Telephone Encounter (Signed)
Pt has medicaid, product is a plan exclusion, medicaid will not pay for weight loss medication. No PA submitted at this time.

## 2023-01-15 ENCOUNTER — Other Ambulatory Visit: Payer: Self-pay | Admitting: Student

## 2023-01-15 DIAGNOSIS — G932 Benign intracranial hypertension: Secondary | ICD-10-CM

## 2023-01-31 ENCOUNTER — Ambulatory Visit: Payer: Medicaid Other

## 2023-02-06 NOTE — Progress Notes (Signed)
Patient for DG Lumbar Puncture on Friday 02/07/2023, I called and spoke with the patient on the phone and gave pre-procedure instructions. Pt was made aware to be here at 9:30a. Pt stated understanding.  Called 01/15/2023 and 01/27/2023 and 02/06/2023

## 2023-02-07 ENCOUNTER — Ambulatory Visit
Admission: RE | Admit: 2023-02-07 | Discharge: 2023-02-07 | Disposition: A | Discharge status: Home / Self Care | Payer: Medicaid Other | Source: Ambulatory Visit | Attending: Student | Admitting: Student

## 2023-02-07 DIAGNOSIS — G932 Benign intracranial hypertension: Secondary | ICD-10-CM | POA: Insufficient documentation

## 2023-02-07 NOTE — Procedures (Addendum)
Technically successful fluoro guided therapeutic LP at L3-L4 level with opening pressure of 29 cm H2O and closing pressure of 17 cm H2O. 11 cc of clear, colorless CSF removed.  No immediate post procedural complication.  Please see imaging section of Epic for full dictation.    Alex Gardener, AGNP-BC 02/07/2023, 10:37 AM

## 2023-02-07 NOTE — Progress Notes (Signed)
Handoff to Jonny Ruiz, Charity fundraiser in special[facility] [address]

## 2023-02-07 NOTE — Discharge Instructions (Signed)
Lumbar Puncture, Care After Refer to this sheet in the next few weeks. These instructions provide you with information on caring for yourself after your procedure. Your health care provider may also give you more specific instructions. Your treatment has been planned according to current medical practices, but problems sometimes occur. Call your health care provider if you have any problems or questions after your procedure. What can I expect after the procedure? After your procedure, it is typical to have the following sensations: Mild discomfort or pain at the insertion site. Mild headache that is relieved with pain medicines.  Follow these instructions at home:  Avoid lifting anything heavier than 10 lb (4.5 kg) for at least 12 hours after the procedure. Drink enough fluids to keep your urine clear or pale yellow. Lay flat or as flat as possible for the remainder of the day. Contact a health care provider if: You have fever or chills. You have nausea or vomiting. You have a headache that lasts for more than 2 days. Get help right away if: You have any numbness or tingling in your legs. You are unable to control your bowel or bladder. You have bleeding or swelling in your back at the insertion site. You are dizzy or faint. This information is not intended to replace advice given to you by your health care provider. Make sure you discuss any questions you have with your health care provider. Document Released: 08/10/2013 Document Revised: 01/11/2016 Document Reviewed: 04/13/2013 Elsevier Interactive Patient Education  2017 [employer] 

## 2023-02-08 HISTORY — PX: OTHER SURGICAL HISTORY: SHX169

## 2023-02-27 ENCOUNTER — Ambulatory Visit: Payer: Medicaid Other | Admitting: Family

## 2023-03-23 NOTE — Progress Notes (Signed)
Office Visit Note  Patient: Carmen Reid [facility]             Date of Birth: June 06, 1996           MRN: [id number]             PCP: Mort Sawyers, FNP Referring: Mort Sawyers, FNP Visit Date: 03/24/2023   Subjective:  New Patient (Initial Visit) (Patient states her knees will get hot and red and swell up. )   History of Present Illness: Carmen Reid is a 27 y.o. female here for evaluation of joint pain and swelling with history of juvenile rheumatoid arthritis. Medical history is also significant for PCOS, IIH, and pedal edema with some venous reflux. She was diagnosed with rheumatoid arthritis in the seventh grade but never seeing a rheumatologist for management long-term or any disease specific drugs.  Was treated with as needed NSAID medications.  She sometimes had joint pains chronically but not something requiring constant medication. More recently especially during this year increased trouble with pain and intermittent swelling in several areas. Most often affecting her knees and sometimes hand and wrist. Does not always have any preceding trigger. 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  Also has lower extremity swelling for years with previous evaluations including workup for possible DVT but ultimately no identified, was temporarily on warfarin for this. Sees some redness on the knees during flare ups. No particular rashes elsewhere.  Labs reviewed ANA neg RF neg ESR 32  Activities of Daily Living:  Patient reports morning stiffness for 10-15 minutes.   Patient Reports nocturnal pain.  Difficulty dressing/grooming: Denies Difficulty climbing stairs: Denies Difficulty getting out of chair: Denies Difficulty using hands for taps, buttons, cutlery, and/or writing: Denies  Review of Systems  Constitutional:  Negative for fatigue.  HENT:  Negative for mouth sores and mouth dryness.   Eyes:  Negative for  dryness.  Respiratory:  Negative for shortness of breath.   Cardiovascular:  Negative for chest pain and palpitations.  Gastrointestinal:  Positive for diarrhea. Negative for blood in stool and constipation.  Endocrine: Negative for increased urination.  Genitourinary:  Negative for involuntary urination.  Musculoskeletal:  Positive for joint pain, joint pain, joint swelling, myalgias, morning stiffness and myalgias. Negative for gait problem, muscle weakness and muscle tenderness.  Skin:  Positive for sensitivity to sunlight. Negative for color change, rash and hair loss.  Allergic/Immunologic: Negative for susceptible to infections.  Neurological:  Positive for dizziness and headaches.  Hematological:  Negative for swollen glands.  Psychiatric/Behavioral:  Positive for depressed mood and sleep disturbance. The patient is nervous/anxious.     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   BMI 40.0-44.9, adult (HCC) 11/29/2021   Pseudotumor cerebri 11/09/2015   Anxiety and depression 12/19/2014   HLD (hyperlipidemia) 12/19/2014   PCOS (polycystic ovarian syndrome) 08/07/2012    Past Medical History:  Diagnosis Date   Anxiety    Depression    High cholesterol    Not on  medication   HTN in pregnancy, chronic    Ovarian cyst    PCOS (polycystic ovarian syndrome)    Vascular disease    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    Family History  Problem Relation Age of Onset   Hypertension Mother    Mental illness Mother     Arthritis Father    Hyperlipidemia Father    Heart disease Father    Cancer Maternal Grandmother 73       breast cancer/skin cancer   Heart disease Maternal Grandmother    Diabetes Maternal Grandmother    Kidney disease Maternal Grandmother    Hyperlipidemia Maternal Grandmother    Hypertension Maternal Grandmother    Arthritis Maternal Grandmother    Mental illness Maternal Grandmother    Heart disease Maternal Grandfather    Prostate cancer Maternal Grandfather 31   Diabetes Maternal Grandfather    Hyperlipidemia Maternal Grandfather    Mental illness Maternal Grandfather    Mental illness Paternal Grandmother    Hypertension Paternal Grandmother    Stroke Maternal Aunt    Liver cancer Maternal Uncle    Kidney disease Maternal Uncle    Colon cancer Maternal Uncle    Heart disease Paternal Uncle    Past Surgical History:  Procedure Laterality Date   addenoidectony     spinal tap  02/08/2023   TONSILLECTOMY     age 10 or 12   Social History   Social History Narrative   One boy    Immunization History  Administered Date(s) Administered   Influenza-Unspecified 09/07/2013   Tdap 04/06/2008     Objective: Vital Signs: BP 133/86 (BP Location: Right Arm, Patient Position: Sitting, Cuff Size: Normal)   Pulse 82   Resp 14   Ht 5\' 5"  (1.651 m)   Wt 232 lb (105.2 kg)   Breastfeeding No   BMI 38.61 kg/m    Physical Exam Constitutional:      Appearance: She is obese.  HENT:     Mouth/Throat:     Mouth: Mucous membranes are moist.     Pharynx: Oropharynx is clear.  Eyes:     Conjunctiva/sclera: Conjunctivae normal.  Cardiovascular:     Rate and Rhythm: Normal rate and regular rhythm.  Pulmonary:     Effort: Pulmonary effort is normal.     Breath sounds: Normal breath sounds.  Lymphadenopathy:     Cervical: No cervical adenopathy.  Skin:    General: Skin is warm and dry.     Findings: No rash.  Neurological:     Mental Status: She is alert.  Psychiatric:         Mood and Affect: Mood normal.     Musculoskeletal Exam:  Shoulders full ROM no tenderness or swelling Elbows full ROM no tenderness or swelling Wrists full ROM no tenderness or swelling Fingers full ROM no tenderness or swelling Knees full ROM no tenderness or swelling   Investigation: No additional findings.  Imaging: No results found.  Recent Labs: Lab Results  Component Value Date   WBC 12.2 (H) 11/14/2022   HGB 14.7 11/14/2022   PLT 330 11/14/2022   NA 137 11/14/2022   K 4.1 11/14/2022   CL 101 11/14/2022   CO2 24 11/14/2022   GLUCOSE 96 11/14/2022   BUN 13 11/14/2022   CREATININE 0.83 11/14/2022   BILITOT 0.2 (L) 04/04/2022   ALKPHOS 82 04/04/2022   AST 13 (L) 04/04/2022   ALT 8 04/04/2022  PROT 5.9 (L) 04/04/2022   ALBUMIN 2.7 (L) 04/04/2022   CALCIUM 9.6 11/14/2022    Speciality Comments: No specialty comments available.  Procedures:  No procedures performed Allergies: Latex, Topiramate, and Warfarin and related   Assessment / Plan:     Visit Diagnoses: Elevated sedimentation rate - Plan: Sedimentation rate, C-reactive protein, C3 and C4, Cyclic citrul peptide antibody, IgG  Mildly elevated sedimentation rate in March although the described symptoms are more episodic and there is no significant swelling or joint tenderness on exam today.  Will recheck this along with CRP complements and CCP antibodies.  If lab results normal would not recommend starting any long-term medication at this time.  Could monitor and try to examine for any joint effusion during a flare up.  Leg swelling  Chronic leg swelling appears to be mostly related to some chronic venous insufficiency.  Probably exacerbated by long periods in dependent position working as a Hospital doctor.  No concerning skin changes cellulitis or neuropathic symptoms.  Orders: Orders Placed This Encounter  Procedures   Sedimentation rate   C-reactive protein   C3 and C4   Cyclic citrul peptide antibody,  IgG   No orders of the defined types were placed in this encounter.    Follow-Up Instructions: No follow-ups on file.   Fuller Plan, MD  Note - This record has been created using [employer].  Chart creation errors have been sought, but may not always  have been located. Such creation errors do not reflect on  the standard of medical care.

## 2023-03-24 ENCOUNTER — Encounter: Payer: Self-pay | Admitting: Internal Medicine

## 2023-03-24 ENCOUNTER — Ambulatory Visit: Payer: Medicaid Other | Attending: Internal Medicine | Admitting: Internal Medicine

## 2023-03-24 VITALS — BP 133/86 | HR 82 | Resp 14 | Ht 65.0 in | Wt 232.0 lb

## 2023-03-24 DIAGNOSIS — M7989 Other specified soft tissue disorders: Secondary | ICD-10-CM | POA: Insufficient documentation

## 2023-03-24 DIAGNOSIS — R700 Elevated erythrocyte sedimentation rate: Secondary | ICD-10-CM | POA: Diagnosis not present

## 2023-03-24 LAB — SEDIMENTATION RATE: Sed Rate: 11 mm/h (ref 0–20)

## 2023-03-25 ENCOUNTER — Encounter: Payer: Self-pay | Admitting: Internal Medicine

## 2023-03-26 NOTE — Telephone Encounter (Signed)
Labs drawn on 03/24/2023. Please advise.

## 2023-03-30 ENCOUNTER — Encounter: Payer: Self-pay | Admitting: Family

## 2023-04-04 ENCOUNTER — Ambulatory Visit: Payer: Medicaid Other | Admitting: Podiatry

## 2023-04-04 DIAGNOSIS — L600 Ingrowing nail: Secondary | ICD-10-CM | POA: Diagnosis not present

## 2023-04-04 NOTE — Progress Notes (Signed)
   Chief Complaint  Patient presents with   Ingrown Toenail    ingrown toenail in both my big toes that's causing an infection.     Subjective: Patient presents today for evaluation of pain to the lateral border bilateral great toes. Patient is concerned for possible ingrown nail.  It is very sensitive to touch.  Patient presents today for further treatment and evaluation.  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  Lateral border bilateral great toes is tender with evidence of an ingrowing nail. Pain on palpation noted to the border of the nail fold. The remaining nails appear unremarkable at this time.   Vascular: DP and PT pulses palpable.  No clinical evidence of vascular compromise  Neruologic: Grossly intact via light touch bilateral.  Musculoskeletal: No pedal deformity noted  Assesement: #1 Paronychia with ingrowing nail lateral border bilateral great toes  Plan of Care:  -Patient evaluated.  -Discussed treatment alternatives and plan of care. Explained nail avulsion procedure and post procedure course to patient. -Patient opted for permanent partial nail avulsion of the ingrown portion of the nail.  -Prior to procedure, local anesthesia infiltration utilized using 3 ml of a 50:50 mixture of 2% plain lidocaine and 0.5% plain  marcaine in a normal hallux block fashion and a betadine prep performed.  -Partial permanent nail avulsion with chemical matrixectomy performed using 3x30sec applications of phenol followed by alcohol flush.  -Light dressing applied.  Post care instructions provided -Patient currently on oral antibiotics from urgent care.  Continue until completed -Return to clinic 3 weeks  Felecia Shelling, DPM [facility]  Dr. Felecia Shelling, DPM    [address] [place], [place] [address]                Office [phone]  Fax [phone]

## 2023-04-29 ENCOUNTER — Encounter: Payer: Self-pay | Admitting: Podiatry

## 2023-04-29 ENCOUNTER — Ambulatory Visit: Payer: Medicaid Other | Admitting: Podiatry

## 2023-04-29 DIAGNOSIS — L03031 Cellulitis of right toe: Secondary | ICD-10-CM

## 2023-04-29 MED ORDER — DOXYCYCLINE HYCLATE 100 MG PO TABS: 100.0000 mg | ORAL_TABLET | Freq: Two times a day (BID) | ORAL | 0 refills | Status: DC

## 2023-04-29 MED ORDER — GENTAMICIN SULFATE 0.1 % EX CREA: 1.0000 | TOPICAL_CREAM | Freq: Two times a day (BID) | CUTANEOUS | 1 refills | Status: DC

## 2023-04-29 NOTE — Progress Notes (Signed)
   Chief Complaint  Patient presents with   Nail Problem    "The right one still hurts."    Subjective: 27 y.o. female presents today status post permanent nail avulsion procedure of the lateral border of the bilateral great toes that was performed on 04/04/2023. Patient soaked the foot and applied the triple antibiotic daily. She has noticed some discomfort to the right toenail avulsion site. No drainage.    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  Skin is warm, dry and supple. There is some erythema to the lateral border of the right hallux nail avulsion site. No drainage. Associated tenderness and sensitivity noted.   Assessment: #1 s/p partial permanent nail matrixectomy LAT B/L great toes. 04/04/23 #2 Mild erythema LAT border RT hallux   Plan of care: -patient was evaluated  -light debridement of the periungual debris was performed to the -border of the respective toe and nail plate using a tissue nipper. -Rx doxycycline 100mg  BID #20 -Rx gentamicin cream apply daily -Return to clinic 2 weeks   Felecia Shelling, DPM [facility]  Dr. Felecia Shelling, DPM    [address] [place], [place] [address]                Office [phone]  Fax [phone]

## 2023-05-13 ENCOUNTER — Ambulatory Visit: Payer: Medicaid Other | Admitting: Podiatry

## 2023-06-05 ENCOUNTER — Other Ambulatory Visit: Payer: Self-pay

## 2023-06-05 ENCOUNTER — Ambulatory Visit: Payer: Medicaid Other

## 2023-06-05 ENCOUNTER — Ambulatory Visit
Admission: EM | Admit: 2023-06-05 | Discharge: 2023-06-05 | Disposition: A | Discharge status: Home / Self Care | Payer: Medicaid Other | Attending: Family Medicine | Admitting: Family Medicine

## 2023-06-05 ENCOUNTER — Encounter: Payer: Self-pay | Admitting: Emergency Medicine

## 2023-06-05 ENCOUNTER — Encounter: Payer: Self-pay | Admitting: Family

## 2023-06-05 DIAGNOSIS — M549 Dorsalgia, unspecified: Secondary | ICD-10-CM

## 2023-06-05 DIAGNOSIS — R0781 Pleurodynia: Secondary | ICD-10-CM | POA: Diagnosis not present

## 2023-06-05 NOTE — ED Triage Notes (Signed)
Cough and congestion started one week ago  Weakness and tiredness started Tuesday.  Yesterday, symptoms worsened.  Pain is mid-back around both sides of torso.  Reports yellow phlegm.    Has taken ibuprofen

## 2023-06-05 NOTE — ED Provider Notes (Signed)
[name]    CSN: [id number] Arrival date & time: 06/05/23  7829      History   Chief Complaint Chief Complaint  Patient presents with   Back Pain    HPI Carmen Reid is a 27 y.o. female.   HPI  This is a pleasant 27 year old woman who has multiple medical problems.  Her problem list is as listed below.  Her chart is reviewed. She states for the last week she has had symptoms.  At first she had congestion and coughing.  These got better, but she is still felt weak and tired.  Starting yesterday she has developed pain in her mid back with deep breathing, cough, sneeze.  She states she feels like she cannot take a deep breath.  She has no congestion or sputum.  No fever or chills.  No anterior chest pain.  It does not hurt to press on her ribs.  She has never had this before.  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    Patient Active Problem List   Diagnosis Date Noted   Leg swelling 03/24/2023   Elevated sedimentation rate 03/24/2023   Abnormal weight gain 12/30/2022   Class 3 severe obesity due to excess calories without serious comorbidity with body mass index (BMI) of 40.0 to 44.9 in adult [facility]) 12/30/2022   Empty sella (HCC) 11/25/2022   Anemia 10/29/2022   History of rheumatoid arthritis 10/29/2022   Idiopathic intracranial hypertension 10/29/2022   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 Authorizing Provider  amLODipine (NORVASC) 10 MG tablet Take 1 tablet (10 mg total) by mouth daily. 12/04/22   Mort Sawyers, FNP  citalopram (CELEXA) 20 MG tablet Take 1 tablet (20 mg total) by mouth daily. 10/29/22   Mort Sawyers, FNP  gentamicin cream (GARAMYCIN) 0.1 % Apply 1 Application topically 2 (two) times daily. 04/29/23   Felecia Shelling, DPM  levonorgestrel (MIRENA) 20 MCG/DAY IUD 1 each by Intrauterine route once. 04/05/22   [provider]  spironolactone (ALDACTONE) 50 MG tablet Take 50 mg by mouth 2 (two) times daily.    [provider]  topiramate (TOPAMAX) 25 MG tablet Take 1 tablet by mouth 2 (two) times daily. 12/23/22 12/23/23  [provider]    Family History Family  History  Problem Relation Age of Onset   Hypertension Mother    Mental illness Mother    Arthritis Father    Hyperlipidemia Father    Heart disease Father    Cancer Maternal Grandmother 68       breast cancer/skin cancer   Heart disease Maternal Grandmother    Diabetes Maternal Grandmother    Kidney disease Maternal Grandmother    Hyperlipidemia Maternal Grandmother    Hypertension Maternal Grandmother    Arthritis Maternal Grandmother    Mental illness Maternal Grandmother    Heart disease Maternal Grandfather    Prostate cancer Maternal Grandfather 60   Diabetes Maternal Grandfather    Hyperlipidemia Maternal Grandfather    Mental illness Maternal Grandfather    Mental illness Paternal Grandmother    Hypertension Paternal Grandmother    Stroke Maternal Aunt    Liver cancer Maternal Uncle    Kidney disease Maternal Uncle    Colon cancer Maternal Uncle    Heart disease Paternal Uncle     Social  History Social History   Tobacco Use   Smoking status: Never    Passive exposure: Never   Smokeless tobacco: Never  Vaping Use   Vaping status: Never Used  Substance Use Topics   Alcohol use: Yes    Comment: once monthly   Drug use: No     Allergies   Latex and Warfarin and related   Review of Systems Review of Systems See HPI  Physical Exam Triage Vital Signs ED Triage Vitals  Encounter Vitals Group     BP 06/05/23 1843 (!) 145/91     Systolic BP Percentile --      Diastolic BP Percentile --      Pulse Rate 06/05/23 1843 (!) 107     Resp 06/05/23 1843 18     Temp 06/05/23 1843 98.7 F (37.1 C)     Temp Source 06/05/23 1843 Oral     SpO2 06/05/23 1843 97 %     Weight --      Height --      Head Circumference --      Peak Flow --      Pain Score 06/05/23 1839 5     Pain Loc --      Pain Education --      Exclude from Growth Chart --    No data found.  Updated Vital Signs BP (!) 145/91 (BP Location: Left Arm) Comment (BP Location): large cuff  Pulse (!) 107   Temp 98.7 F (37.1 C) (Oral)   Resp 18   LMP 05/15/2023   SpO2 97%      Physical Exam Constitutional:      General: She is not in acute distress.    Appearance: She is well-developed.     Comments: Pleasant.  Overweight.  No acute distress  HENT:     Head: Normocephalic and atraumatic.  Eyes:     Conjunctiva/sclera: Conjunctivae normal.     Pupils: Pupils are equal, round, and reactive to light.  Cardiovascular:     Rate and Rhythm: Normal rate and regular rhythm.  Pulmonary:     Effort: Pulmonary effort is normal. No respiratory distress.     Breath sounds: Normal breath sounds.  Chest:     Chest wall: No tenderness.  Abdominal:     General: There is no distension.     Palpations: Abdomen is soft.  Musculoskeletal:        General: Normal range of  motion.     Cervical back: Normal range of motion.  Skin:    General: Skin is warm and dry.  Neurological:     Mental Status: She is  alert.      UC Treatments / Results  Labs (all labs ordered are listed, but only abnormal results are displayed) Labs Reviewed - No data to display  EKG   Radiology No results found.  Procedures Procedures (including critical care time)  Medications Ordered in UC Medications - No data to display  Initial Impression / Assessment and Plan / UC Course  I have reviewed the triage vital signs and the nursing notes.  Pertinent labs & imaging results that were available during my care of the patient were reviewed by me and considered in my medical decision making (see chart for details).    X-ray was read in the absence of radiology.  It is normal.  Patient will be called with any change in therapy is indicated  Final Clinical Impressions(s) / UC Diagnoses   Final diagnoses:  Mid-back pain, acute  Pleuritic chest pain     Discharge Instructions      Take prednisone once a day for the next 5 days.  Prednisone is a strong anti-inflammatory to help with the pleurisy pain See your doctor if not improving by Monday     ED Prescriptions   None    PDMP not reviewed this encounter.   Eustace Moore, MD 06/05/23 [phone]

## 2023-06-05 NOTE — Discharge Instructions (Addendum)
Take prednisone once a day for the next 5 days.  Prednisone is a strong anti-inflammatory to help with the pleurisy pain See your doctor if not improving by Monday

## 2023-06-05 NOTE — ED Triage Notes (Signed)
Coughing or sneezing causes back pain to be very significant

## 2023-06-06 ENCOUNTER — Telehealth: Payer: Self-pay

## 2023-06-06 MED ORDER — PREDNISONE 50 MG PO TABS: ORAL_TABLET | ORAL | 0 refills | Status: DC

## 2023-06-06 NOTE — Telephone Encounter (Signed)
Pt called stating rx was never send. Strength verified by provider and sent to pts preferred pharmacy. Pt notified rx has been sent.

## 2023-06-24 ENCOUNTER — Encounter: Payer: Self-pay | Admitting: Family

## 2023-06-24 ENCOUNTER — Ambulatory Visit: Payer: Medicaid Other | Admitting: Family

## 2023-06-24 VITALS — BP 142/92 | HR 72 | Temp 97.7°F | Ht 64.0 in | Wt 232.8 lb

## 2023-06-24 DIAGNOSIS — E782 Mixed hyperlipidemia: Secondary | ICD-10-CM | POA: Diagnosis not present

## 2023-06-24 DIAGNOSIS — R635 Abnormal weight gain: Secondary | ICD-10-CM | POA: Diagnosis not present

## 2023-06-24 DIAGNOSIS — F419 Anxiety disorder, unspecified: Secondary | ICD-10-CM | POA: Diagnosis not present

## 2023-06-24 DIAGNOSIS — Z6841 Body Mass Index (BMI) 40.0 and over, adult: Secondary | ICD-10-CM

## 2023-06-24 DIAGNOSIS — F32A Depression, unspecified: Secondary | ICD-10-CM | POA: Diagnosis not present

## 2023-06-24 LAB — LIPID PANEL
Cholesterol: 198 mg/dL (ref 0–200)
HDL: 43.4 mg/dL (ref >39.00)
LDL Cholesterol: 125 mg/dL — ABNORMAL HIGH (ref 0–99)
NonHDL: 154.36
Total CHOL/HDL Ratio: 5
Triglycerides: 149 mg/dL (ref 0.0–149.0)
VLDL: 29.8 mg/dL (ref 0.0–40.0)

## 2023-06-24 LAB — TSH: TSH: 2.64 u[IU]/mL (ref 0.35–5.50)

## 2023-06-24 MED ORDER — FLUOXETINE HCL 20 MG PO CAPS: 20.0000 mg | ORAL_CAPSULE | Freq: Every day | ORAL | 3 refills | Status: DC

## 2023-06-24 MED ORDER — WEGOVY 1 MG/0.5ML ~~LOC~~ SOAJ: 1.0000 mg | SUBCUTANEOUS | 2 refills | Status: DC

## 2023-06-24 MED ORDER — WEGOVY 0.25 MG/0.5ML ~~LOC~~ SOAJ
SUBCUTANEOUS | 0 refills | Status: DC
Start: 2023-06-24 — End: 2023-09-24

## 2023-06-24 MED ORDER — WEGOVY 0.5 MG/0.5ML ~~LOC~~ SOAJ
SUBCUTANEOUS | 0 refills | Status: DC
Start: 2023-07-15 — End: 2023-09-24

## 2023-06-24 NOTE — Progress Notes (Signed)
Established Patient Office Visit  Subjective:      CC:  Chief Complaint  Patient presents with   Depression    HPI: Carmen Reid is a 27 y.o. female presenting on 06/24/2023 for Depression . Anxiety depression: celexa 20 mg , has always helped with anxiety per her but not her depression. No suicidal thoughts but decreased motivation and not feeling like doing anything. She does sleep as much as her son lets her, she would sleep more if she could. Appetite well. Does feel like a failure and  or hopelessness. No SI/ HI has only ever tried celexa. Six years ago with initial diagnosis of severe depression she thinks she was on prozac.   She denies any h/o suicide attempts.  She has tried to see one therapist in the past but didn't find it was helpful.   Hyperlipidemia: heavy on dairy, doesn't like fried foods. She does eat out about once a week, only eats about one meal a day because she isn't hungry.  Wt Readings from Last 3 Encounters:  06/24/23 232 lb 12.8 oz (105.6 kg)  03/24/23 232 lb (105.2 kg)  02/07/23 230 lb (104.3 kg)   Exercise: tries to get out and walk at least once a day. Works 12-14 hour days at a time, drives often. Hard to lose weight, has pcos.    Social history:  Relevant past medical, surgical, family and social history reviewed and updated as indicated. Interim medical history since our last visit reviewed.  Allergies and medications reviewed and updated.  DATA REVIEWED: CHART IN EPIC     ROS: Negative unless specifically indicated above in HPI.    Current Outpatient Medications:    FLUoxetine (PROZAC) 20 MG capsule, Take 1 capsule (20 mg total) by mouth daily., Disp: 90 capsule, Rfl: 3   Semaglutide-Weight Management (WEGOVY) 0.25 MG/0.5ML SOAJ, Start 0.25 mg qweek for four weeks, then increase to 0.5 mg dose qweek, Disp: 2 mL, Rfl: 0   [START ON 07/15/2023] Semaglutide-Weight Management (WEGOVY) 0.5 MG/0.5ML SOAJ, After four weeks of  0.25 mg qweek, increase to 0.5 mg qweek, Disp: 2 mL, Rfl: 0   Semaglutide-Weight Management (WEGOVY) 1 MG/0.5ML SOAJ, Inject 1 mg into the skin once a week., Disp: 2 mL, Rfl: 2   amLODipine (NORVASC) 10 MG tablet, Take 1 tablet (10 mg total) by mouth daily., Disp: 90 tablet, Rfl: 3   levonorgestrel (MIRENA) 20 MCG/DAY IUD, 1 each by Intrauterine route once., Disp: , Rfl:    topiramate (TOPAMAX) 25 MG tablet, Take 1 tablet by mouth 2 (two) times daily., Disp: , Rfl:       Objective:    BP (!) 142/92 (BP Location: Left Arm, Patient Position: Sitting, Cuff Size: Normal)   Pulse 72   Temp 97.7 F (36.5 C) (Temporal)   Ht 5\' 4"  (1.626 m)   Wt 232 lb 12.8 oz (105.6 kg)   LMP 05/15/2023   SpO2 97%   BMI 39.96 kg/m   Wt Readings from Last 3 Encounters:  06/24/23 232 lb 12.8 oz (105.6 kg)  03/24/23 232 lb (105.2 kg)  02/07/23 230 lb (104.3 kg)    Physical Exam Constitutional:      General: She is not in acute distress.    Appearance: Normal appearance. She is obese. She is not ill-appearing, toxic-appearing or diaphoretic.  HENT:     Head: Normocephalic.  Cardiovascular:     Rate and Rhythm: Normal rate.  Pulmonary:     Effort: Pulmonary  effort is normal.  Musculoskeletal:        General: Normal range of motion.  Neurological:     General: No focal deficit present.     Mental Status: She is alert and oriented to person, place, and time. Mental status is at baseline.  Psychiatric:        Mood and Affect: Mood normal.        Behavior: Behavior normal.        Thought Content: Thought content normal.        Judgment: Judgment normal.           Assessment & Plan:  Anxiety and depression Assessment & Plan: Worsening depression  Stop celexa, trial start prozac 20 mg once daily  Referral and recommendation for therapy    Orders: -     FLUoxetine HCl; Take 1 capsule (20 mg total) by mouth daily.  Dispense: 90 capsule; Refill: 3  Abnormal weight gain -     TSH -      Wegovy; After four weeks of 0.25 mg qweek, increase to 0.5 mg qweek  Dispense: 2 mL; Refill: 0 -     Wegovy; Start 0.25 mg qweek for four weeks, then increase to 0.5 mg dose qweek  Dispense: 2 mL; Refill: 0 -     WUJWJX; Inject 1 mg into the skin once a week.  Dispense: 2 mL; Refill: 2  Mixed hyperlipidemia Assessment & Plan: Ordered lipid panel, pending results. Work on low cholesterol diet and exercise as tolerated May consider statin pending results.  Orders: -     Lipid panel -     Lipoprotein A (LPA)  BMI 40.0-44.9, adult [facility]) Assessment & Plan: The beneficiary does not have any FDA labeled contraindications to the requested agent including pregnancy, lactation, h/o medullary thyroid cancer or multiple endocrine neoplasia type II.   Trial wegovy instructions given to pt.  Referral placed for medical weight management as well.      Orders: -     Wegovy; After four weeks of 0.25 mg qweek, increase to 0.5 mg qweek  Dispense: 2 mL; Refill: 0 -     Wegovy; Start 0.25 mg qweek for four weeks, then increase to 0.5 mg dose qweek  Dispense: 2 mL; Refill: 0 -     BJYNWG; Inject 1 mg into the skin once a week.  Dispense: 2 mL; Refill: 2 -     Amb Ref to Medical Weight Management     Return in about 3 months (around 09/24/2023) for f/u depression, f/u weight loss medication.  Mort Sawyers, MSN, APRN, FNP-C [facility]

## 2023-06-24 NOTE — Assessment & Plan Note (Addendum)
The beneficiary does not have any FDA labeled contraindications to the requested agent including pregnancy, lactation, h/o medullary thyroid cancer or multiple endocrine neoplasia type II.   Trial wegovy instructions given to pt.  Referral placed for medical weight management as well.

## 2023-06-24 NOTE — Telephone Encounter (Signed)
Please start PA for Wegovy.

## 2023-06-24 NOTE — Patient Instructions (Addendum)
Stop celexa Start Prozac 20 mg once daily.   Trial wegovy for weight loss A referral was placed today for weight loss clinic.  Please let us know if you have not heard back within 2 weeks about the referral.  ------------------------------------   Here are some medicaid recommendations, if these do not work out I suggest that you call your insurance and see available options and we can also place that referral appropriately with whatever you may find.  [facility] Phone # [phone] They have locations in [place], [place], [place], and [place]. They do take [employer] I know for sure and the [employer].   [facility] [address] [phone]  [address] [place], [place], [place] [address]    [facility] [employer] [address] [place], [place] [address]   [phone]   [facility]- [phone]   [facility] [phone]   [place]- [phone]   If at any time you feel your needs are more urgent or you are concerned for your well being please see the below options:  For walk in options for mental health:   [facility] [address] in [place], [place]. Call our 24-hour HelpLine at [phone] or [phone] for immediate assistance for mental health and substance abuse issues.  And or walk into [facility] hospital ER.   [employer]: 1-800-SUICIDE  The [employer] Suicide Prevention Lifeline: 1-800-273-TALK  Regards,   Mort Sawyers FNP-C

## 2023-06-24 NOTE — Assessment & Plan Note (Signed)
Ordered lipid panel, pending results. Work on low cholesterol diet and exercise as tolerated May consider statin pending results.

## 2023-06-24 NOTE — Assessment & Plan Note (Signed)
Worsening depression  Stop celexa, trial start prozac 20 mg once daily  Referral and recommendation for therapy

## 2023-06-25 ENCOUNTER — Telehealth: Payer: Self-pay

## 2023-06-25 NOTE — Telephone Encounter (Signed)
Pharmacy Patient Advocate Encounter   Received notification from Patient Advice Request messages that prior authorization for [facility] 0.25MG /0.5ML auto-injectors is required/requested.   Insurance verification completed.   The patient is insured through [facility] .   Per test claim: PA required; PA submitted to above mentioned insurance via CoverMyMeds Key/confirmation #/EOC [id number] Status is pending

## 2023-06-25 NOTE — Telephone Encounter (Signed)
Pharmacy Patient Advocate Encounter  Received notification from [facility] that Prior Authorization for [facility] has been APPROVED from 06/25/23 to 12/22/23   PA #/Case ID/Reference #: [id number]

## 2023-06-29 LAB — LIPOPROTEIN A (LPA): Lipoprotein (a): 41 nmol/L (ref <75)

## 2023-09-24 ENCOUNTER — Encounter: Payer: Self-pay | Admitting: Family

## 2023-09-24 ENCOUNTER — Ambulatory Visit: Payer: Medicaid Other | Admitting: Family

## 2023-09-24 VITALS — BP 128/88 | HR 115 | Temp 97.7°F | Ht 64.0 in | Wt 209.8 lb

## 2023-09-24 DIAGNOSIS — Z6841 Body Mass Index (BMI) 40.0 and over, adult: Secondary | ICD-10-CM

## 2023-09-24 DIAGNOSIS — F419 Anxiety disorder, unspecified: Secondary | ICD-10-CM

## 2023-09-24 DIAGNOSIS — E66813 Obesity, class 3: Secondary | ICD-10-CM

## 2023-09-24 DIAGNOSIS — G932 Benign intracranial hypertension: Secondary | ICD-10-CM

## 2023-09-24 DIAGNOSIS — R110 Nausea: Secondary | ICD-10-CM

## 2023-09-24 DIAGNOSIS — F32A Depression, unspecified: Secondary | ICD-10-CM

## 2023-09-24 MED ORDER — ONDANSETRON HCL 4 MG PO TABS: 4.0000 mg | ORAL_TABLET | Freq: Three times a day (TID) | ORAL | 0 refills | Status: DC | PRN

## 2023-09-24 MED ORDER — WEGOVY 1.7 MG/0.75ML ~~LOC~~ SOAJ: 1.7000 mg | SUBCUTANEOUS | 0 refills | Status: DC

## 2023-09-24 NOTE — Progress Notes (Signed)
 Established Patient Office Visit  Subjective:   Patient ID: Carmen Reid, female    DOB: [birth year]  Age: 28 y.o. MRN: [id number]  CC:  Chief Complaint  Patient presents with   Medical Management of Chronic Issues    HPI: Carmen Reid is a 28 y.o. female presenting on 09/24/2023 for Medical Management of Chronic Issues  Obesity: currently on 1 mg dose of wegovy . Tolerating well.  Exercise: active, and goes on walks daily.  Diet: improving, still doesn't have much of an increased appetite. Eats once a day. Tries to eat fruits and snacks throughout the day.   HTN: pt self d/c her amlodipine  because she was feeling dizzy and sluggish.  She states has felt better since the d/c.     ROS: Negative unless specifically indicated above in HPI.   Relevant past medical history reviewed and updated as indicated.   Allergies and medications reviewed and updated.   Current Outpatient Medications:    FLUoxetine  (PROZAC ) 20 MG capsule, Take 1 capsule (20 mg total) by mouth daily., Disp: 90 capsule, Rfl: 3   levonorgestrel  (MIRENA ) 20 MCG/DAY IUD, 1 each by Intrauterine route once., Disp: , Rfl:    ondansetron  (ZOFRAN ) 4 MG tablet, Take 1 tablet (4 mg total) by mouth every 8 (eight) hours as needed for nausea or vomiting., Disp: 20 tablet, Rfl: 0   Semaglutide -Weight Management (WEGOVY ) 1.7 MG/0.75ML SOAJ, Inject 1.7 mg into the skin once a week., Disp: 3 mL, Rfl: 0   topiramate (TOPAMAX) 25 MG tablet, Take 1 tablet by mouth 2 (two) times daily., Disp: , Rfl:   Allergies  Allergen Reactions   Latex Swelling   Warfarin And Related Dermatitis    Objective:   BP 128/88 (BP Location: Left Arm, Patient Position: Sitting, Cuff Size: Large)   Pulse (!) 115   Temp 97.7 F (36.5 C) (Temporal)   Ht 5' 4 (1.626 m)   Wt 209 lb 12.8 oz (95.2 kg)   SpO2 98%   BMI 36.01 kg/m    Physical Exam Constitutional:      General: She is not in acute distress.     Appearance: Normal appearance. She is normal weight. She is not ill-appearing, toxic-appearing or diaphoretic.  HENT:     Head: Normocephalic.  Cardiovascular:     Rate and Rhythm: Normal rate and regular rhythm.  Pulmonary:     Effort: Pulmonary effort is normal.     Breath sounds: Normal breath sounds.  Musculoskeletal:        General: Normal range of motion.  Neurological:     General: No focal deficit present.     Mental Status: She is alert and oriented to person, place, and time. Mental status is at baseline.  Psychiatric:        Mood and Affect: Mood normal.        Behavior: Behavior normal.        Thought Content: Thought content normal.        Judgment: Judgment normal.     Assessment & Plan:  Nausea -     Ondansetron  HCl; Take 1 tablet (4 mg total) by mouth every 8 (eight) hours as needed for nausea or vomiting.  Dispense: 20 tablet; Refill: 0  Class 3 severe obesity due to excess calories without serious comorbidity with body mass index (BMI) of 40.0 to 44.9 in adult [facility]) Assessment & Plan: Increase wegovy  to 1.7 mg weekly  Pt advised to work on diet  and exercise as tolerated Sending in zofran  for slight nausea day of taking injection.  Monitor for worsening nausea, development of vomiting and or constipation out of the ordinary.   Orders: -     Wegovy ; Inject 1.7 mg into the skin once a week.  Dispense: 3 mL; Refill: 0  Anxiety and depression Assessment & Plan: Stable doing well continue prozac  20 mg once daily       Follow up plan: Return in about 6 months (around 03/23/2024) for f/u cholesterol, f/u CPE.  [name], FNP

## 2023-09-24 NOTE — Assessment & Plan Note (Signed)
 Stable doing well continue prozac  20 mg once daily

## 2023-09-24 NOTE — Assessment & Plan Note (Signed)
 Pt doing well off of blood pressure meds with weight loss however did send her home with a blood pressure log to continue to monitor

## 2023-09-24 NOTE — Assessment & Plan Note (Signed)
 Increase wegovy  to 1.7 mg weekly  Pt advised to work on diet and exercise as tolerated Sending in zofran  for slight nausea day of taking injection.  Monitor for worsening nausea, development of vomiting and or constipation out of the ordinary.

## 2023-09-29 ENCOUNTER — Encounter (INDEPENDENT_AMBULATORY_CARE_PROVIDER_SITE_OTHER): Payer: Self-pay

## 2023-10-16 ENCOUNTER — Encounter: Payer: Self-pay | Admitting: Family

## 2023-10-19 ENCOUNTER — Other Ambulatory Visit: Payer: Self-pay | Admitting: Family

## 2023-10-19 DIAGNOSIS — E66813 Obesity, class 3: Secondary | ICD-10-CM

## 2023-10-20 ENCOUNTER — Encounter (INDEPENDENT_AMBULATORY_CARE_PROVIDER_SITE_OTHER): Payer: Self-pay

## 2023-10-20 ENCOUNTER — Encounter: Payer: Self-pay | Admitting: Family

## 2023-10-20 DIAGNOSIS — Z6841 Body Mass Index (BMI) 40.0 and over, adult: Secondary | ICD-10-CM

## 2023-10-20 DIAGNOSIS — E785 Hyperlipidemia, unspecified: Secondary | ICD-10-CM

## 2023-10-20 DIAGNOSIS — G932 Benign intracranial hypertension: Secondary | ICD-10-CM

## 2023-10-20 MED ORDER — SEMAGLUTIDE-WEIGHT MANAGEMENT 2.4 MG/0.75ML ~~LOC~~ SOAJ: 2.4000 mg | SUBCUTANEOUS | 1 refills | Status: DC

## 2023-10-28 ENCOUNTER — Encounter (INDEPENDENT_AMBULATORY_CARE_PROVIDER_SITE_OTHER): Payer: Self-pay

## 2024-01-19 ENCOUNTER — Telehealth: Payer: Self-pay

## 2024-01-19 ENCOUNTER — Other Ambulatory Visit (HOSPITAL_COMMUNITY): Payer: Self-pay

## 2024-01-19 NOTE — Telephone Encounter (Signed)
 Pharmacy Patient Advocate Encounter   Received notification from CoverMyMeds that prior authorization for Wegovy  2.4MG /0.75ML auto-injectors is required/requested.   Insurance verification completed.   The patient is insured through [facility] .   Per test claim: PA required; However, NEW/RECENT labs/notes are needed to complete & submit PA request. Please see below.  Please provide patient's current weight (within the past 45 days of this prior auth request)

## 2024-01-22 ENCOUNTER — Telehealth: Payer: Self-pay | Admitting: Family

## 2024-01-22 ENCOUNTER — Telehealth: Admitting: Family

## 2024-01-22 ENCOUNTER — Encounter: Payer: Self-pay | Admitting: Family

## 2024-01-22 VITALS — BP 120/86 | HR 68 | Temp 98.0°F | Ht 64.0 in | Wt 200.0 lb

## 2024-01-22 DIAGNOSIS — Z6841 Body Mass Index (BMI) 40.0 and over, adult: Secondary | ICD-10-CM

## 2024-01-22 DIAGNOSIS — G932 Benign intracranial hypertension: Secondary | ICD-10-CM | POA: Diagnosis not present

## 2024-01-22 DIAGNOSIS — E66813 Obesity, class 3: Secondary | ICD-10-CM | POA: Diagnosis not present

## 2024-01-22 DIAGNOSIS — E66811 Obesity, class 1: Secondary | ICD-10-CM | POA: Diagnosis not present

## 2024-01-22 DIAGNOSIS — E782 Mixed hyperlipidemia: Secondary | ICD-10-CM

## 2024-01-22 DIAGNOSIS — E6609 Other obesity due to excess calories: Secondary | ICD-10-CM

## 2024-01-22 DIAGNOSIS — Z6834 Body mass index (BMI) 34.0-34.9, adult: Secondary | ICD-10-CM

## 2024-01-22 LAB — LIPID PANEL
Cholesterol: 241 mg/dL — ABNORMAL HIGH (ref 0–200)
HDL: 42.5 mg/dL (ref >39.00)
LDL Cholesterol: 180 mg/dL — ABNORMAL HIGH (ref 0–99)
NonHDL: 198.89
Total CHOL/HDL Ratio: 6
Triglycerides: 95 mg/dL (ref 0.0–149.0)
VLDL: 19 mg/dL (ref 0.0–40.0)

## 2024-01-22 LAB — HEMOGLOBIN A1C: Hgb A1c MFr Bld: 5 % (ref 4.6–6.5)

## 2024-01-22 NOTE — Progress Notes (Signed)
 Virtual Visit via Video note  I connected with Carmen Reid on 01/22/24 at the office by video and verified that I am speaking with the correct person using two identifiers.The provider, Felicita Horns, FNP is located in their home at time of visit.  I discussed the limitations, risks, security and privacy concerns of performing an evaluation and management service by video and the availability of in person appointments. I also discussed with the patient that there may be a patient responsible charge related to this service. The patient expressed understanding and agreed to proceed.  Subjective: PCP: Felicita Horns, FNP  Chief Complaint  Patient presents with   Medical Management of Chronic Issues    HPI  Obesity, currently on wegovy  2.4 mg weekly tolerating well and having good weight loss, total so far is 32 pounds.   Exercise: on the cycle at home daily for 10-20 minutes a day  Diet: working on portion control, less fatty foods. Is not counting calories at current.   Denies nausea, constipation, abdominal pain.   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   She has found it easier to exercise with her weight loss.   Wt Readings from Last 3 Encounters:  01/22/24 200 lb (90.7 kg)  09/24/23 209 lb 12.8 oz (95.2 kg)  06/24/23 232 lb 12.8 oz (105.6 kg)         ROS: Per HPI  Current Outpatient Medications:    FLUoxetine  (PROZAC ) 20 MG capsule, Take 1 capsule (20 mg total) by mouth daily., Disp: 90 capsule, Rfl: 3   levonorgestrel  (MIRENA ) 20 MCG/DAY IUD, 1 each by Intrauterine route once., Disp: , Rfl:    ondansetron  (ZOFRAN ) 4 MG tablet, Take 1 tablet (4 mg total) by mouth every 8 (eight) hours as needed for nausea or vomiting., Disp: 20 tablet, Rfl: 0   Semaglutide -Weight Management 2.4 MG/0.75ML SOAJ, Inject 2.4 mg into the skin once a week., Disp:  9 mL, Rfl: 1  Observations/Objective: Physical Exam Constitutional:      General: She is not in acute distress.    Appearance: Normal appearance. She is normal weight. She is not ill-appearing, toxic-appearing or diaphoretic.  HENT:     Head: Normocephalic.  Cardiovascular:     Rate and Rhythm: Normal rate.  Pulmonary:     Effort: Pulmonary effort is normal.  Musculoskeletal:        General: Normal range of motion.  Neurological:     General: No focal deficit present.     Mental Status: She is alert and oriented to person, place, and time. Mental status is at baseline.  Psychiatric:        Mood and Affect: Mood normal.        Behavior: Behavior normal.        Thought Content: Thought content normal.        Judgment: Judgment normal.     Assessment and Plan: Idiopathic intracranial hypertension  Mixed hyperlipidemia Assessment & Plan: Ordered lipid panel, pending results. Work on low cholesterol diet and exercise as tolerated   Orders: -     Lipid panel  Class 3 severe obesity due to excess calories without serious comorbidity with body mass index (BMI) of 40.0 to 44.9 in adult -     Hemoglobin A1c  Class 1 obesity due to excess calories without serious comorbidity with body mass index (BMI) of 34.0  to 34.9 in adult Assessment & Plan: Pt has had significant reduction in BMI and improvement in her ICH symptoms.  Cholesterol to be repeated today as well as A1c ordered  I recommend she continue on wegovy  for additional weight loss and to aide improvement in her co morbidities.        Follow Up Instructions: Return in about 6 months (around 07/23/2024) for f/u CPE.   I discussed the assessment and treatment plan with the patient. The patient was provided an opportunity to ask questions and all were answered. The patient agreed with the plan and demonstrated an understanding of the instructions.   The patient was advised to call back or seek an in-person evaluation if  the symptoms worsen or if the condition fails to improve as anticipated.  The above assessment and management plan was discussed with the patient. The patient verbalized understanding of and has agreed to the management plan. Patient is aware to call the clinic if symptoms persist or worsen. Patient is aware when to return to the clinic for a follow-up visit. Patient educated on when it is appropriate to go to the emergency department.     Felicita Horns, MSN, APRN, FNP-C [facility]

## 2024-01-22 NOTE — Assessment & Plan Note (Signed)
 Pt has had significant reduction in BMI and improvement in her ICH symptoms.  Cholesterol to be repeated today as well as A1c ordered  I recommend she continue on wegovy  for additional weight loss and to aide improvement in her co morbidities.

## 2024-01-22 NOTE — Telephone Encounter (Signed)
 Start prior auth for wegovy .  Can we wait until tomorrow 6/6 to submit so lab work is in.   Needs prior auth with recent weight and labs (I ordered A1c and lipid panel if you could include)   2.4 wegovy 

## 2024-01-22 NOTE — Assessment & Plan Note (Signed)
 Ordered lipid panel, pending results. Work on low cholesterol diet and exercise as tolerated

## 2024-01-23 ENCOUNTER — Telehealth: Payer: Self-pay

## 2024-01-23 ENCOUNTER — Other Ambulatory Visit (HOSPITAL_COMMUNITY): Payer: Self-pay

## 2024-01-23 NOTE — Telephone Encounter (Signed)
 Pharmacy Patient Advocate Encounter  Received notification from [facility] that Prior Authorization for Wegovy  2.4 has been APPROVED from 01/23/24 to 01/22/25. Ran test claim, Copay is $4.00. This test claim was processed through [facility]- copay amounts may vary at other pharmacies due to pharmacy/plan contracts, or as the patient moves through the different stages of their insurance plan.   PA #/Case ID/Reference #: [id number]

## 2024-01-23 NOTE — Telephone Encounter (Signed)
 Pharmacy Patient Advocate Encounter   Received notification from Physician's Office that prior authorization for Wegovy  2.4 is required/requested.   Insurance verification completed.   The patient is insured through [facility] .   Per test claim: PA required; PA submitted to above mentioned insurance via CoverMyMeds Key/confirmation #/EOC [facility] Status is pending

## 2024-01-26 ENCOUNTER — Other Ambulatory Visit: Payer: Self-pay | Admitting: Family

## 2024-01-26 ENCOUNTER — Ambulatory Visit: Payer: Self-pay | Admitting: Family

## 2024-01-26 ENCOUNTER — Other Ambulatory Visit (HOSPITAL_COMMUNITY): Payer: Self-pay

## 2024-01-26 DIAGNOSIS — E782 Mixed hyperlipidemia: Secondary | ICD-10-CM

## 2024-03-23 ENCOUNTER — Telehealth: Payer: Self-pay | Admitting: Family

## 2024-03-23 ENCOUNTER — Encounter: Payer: Self-pay | Admitting: Family

## 2024-03-23 ENCOUNTER — Ambulatory Visit: Payer: Medicaid Other | Admitting: Family

## 2024-03-23 VITALS — BP 102/80 | HR 84 | Temp 98.0°F | Ht 64.0 in | Wt 203.4 lb

## 2024-03-23 DIAGNOSIS — D500 Iron deficiency anemia secondary to blood loss (chronic): Secondary | ICD-10-CM

## 2024-03-23 DIAGNOSIS — Z6834 Body mass index (BMI) 34.0-34.9, adult: Secondary | ICD-10-CM | POA: Diagnosis not present

## 2024-03-23 DIAGNOSIS — E782 Mixed hyperlipidemia: Secondary | ICD-10-CM

## 2024-03-23 DIAGNOSIS — G479 Sleep disorder, unspecified: Secondary | ICD-10-CM

## 2024-03-23 DIAGNOSIS — E66811 Obesity, class 1: Secondary | ICD-10-CM | POA: Diagnosis not present

## 2024-03-23 DIAGNOSIS — Z0000 Encounter for general adult medical examination without abnormal findings: Secondary | ICD-10-CM | POA: Diagnosis not present

## 2024-03-23 DIAGNOSIS — E6609 Other obesity due to excess calories: Secondary | ICD-10-CM

## 2024-03-23 DIAGNOSIS — F32A Depression, unspecified: Secondary | ICD-10-CM

## 2024-03-23 DIAGNOSIS — F419 Anxiety disorder, unspecified: Secondary | ICD-10-CM

## 2024-03-23 DIAGNOSIS — G932 Benign intracranial hypertension: Secondary | ICD-10-CM

## 2024-03-23 LAB — CBC
HCT: 42.7 % (ref 36.0–46.0)
Hemoglobin: 14.5 g/dL (ref 12.0–15.0)
MCHC: 33.9 g/dL (ref 30.0–36.0)
MCV: 88.6 fl (ref 78.0–100.0)
Platelets: 256 K/uL (ref 150.0–400.0)
RBC: 4.82 Mil/uL (ref 3.87–5.11)
RDW: 13.4 % (ref 11.5–15.5)
WBC: 9.4 K/uL (ref 4.0–10.5)

## 2024-03-23 LAB — LIPID PANEL
Cholesterol: 232 mg/dL — ABNORMAL HIGH (ref 0–200)
HDL: 45.9 mg/dL (ref >39.00)
LDL Cholesterol: 159 mg/dL — ABNORMAL HIGH (ref 0–99)
NonHDL: 186.58
Total CHOL/HDL Ratio: 5
Triglycerides: 139 mg/dL (ref 0.0–149.0)
VLDL: 27.8 mg/dL (ref 0.0–40.0)

## 2024-03-23 LAB — COMPREHENSIVE METABOLIC PANEL WITH GFR
ALT: 13 U/L (ref 0–35)
AST: 10 U/L (ref 0–37)
Albumin: 4.9 g/dL (ref 3.5–5.2)
Alkaline Phosphatase: 48 U/L (ref 39–117)
BUN: 14 mg/dL (ref 6–23)
CO2: 25 meq/L (ref 19–32)
Calcium: 9.9 mg/dL (ref 8.4–10.5)
Chloride: 103 meq/L (ref 96–112)
Creatinine, Ser: 0.77 mg/dL (ref 0.40–1.20)
GFR: 104.94 mL/min (ref >60.00)
Glucose, Bld: 77 mg/dL (ref 70–99)
Potassium: 4.2 meq/L (ref 3.5–5.1)
Sodium: 137 meq/L (ref 135–145)
Total Bilirubin: 0.6 mg/dL (ref 0.2–1.2)
Total Protein: 7.4 g/dL (ref 6.0–8.3)

## 2024-03-23 LAB — TSH: TSH: 3.55 u[IU]/mL (ref 0.35–5.50)

## 2024-03-23 MED ORDER — FLUOXETINE HCL 40 MG PO CAPS: 40.0000 mg | ORAL_CAPSULE | Freq: Every day | ORAL | 3 refills | Status: AC

## 2024-03-23 NOTE — Patient Instructions (Signed)
Increase prozac to 40 mg once daily.

## 2024-03-23 NOTE — Assessment & Plan Note (Signed)
 Ordered lipid panel, pending results. Work on low cholesterol diet and exercise as tolerated Lpa ordered pending results.  Could consider statin, pt done with pregnancy, does not want any more kids.

## 2024-03-23 NOTE — Assessment & Plan Note (Signed)
 Increase prozac  to 40 mg once daily  Caution with trazodone due to ICH however could consider and have pt monitor blood pressure.

## 2024-03-23 NOTE — Assessment & Plan Note (Signed)
 F/u with neurology  Still symptomatic   Am headaches/snoring, pt would like to decline sleep study for now

## 2024-03-23 NOTE — Progress Notes (Signed)
 Subjective:  Patient ID: Carmen Reid, female    DOB: [birth year]  Age: 28 y.o. MRN: [id number]  Patient Care Team: Corwin Antu, FNP as PCP - General (Family Medicine) Eldonna Suzen Octave, MD as PCP - OBGYN (Obstetrics and Gynecology)   CC:  Chief Complaint  Patient presents with   Annual Exam    HPI Carmen Reid is a 28 y.o. female who presents today for an annual physical exam. She reports consuming a general diet. The patient does not participate in regular exercise at present. She generally feels well. She reports sleeping poorly, only sleeps about 3-4 hours of sleep a night which is abn for her. . She does not have additional problems to discuss today.   Vision:Within last year Dental:No regular dental care  Last pap: 2023, negative  Pt is without acute concerns.   Obesity: has overall lost 40 pounds and tolerating wegovy  2.4 weekly.   Wt Readings from Last 3 Encounters:  03/23/24 203 lb 6.4 oz (92.3 kg)  01/22/24 200 lb (90.7 kg)  09/24/23 209 lb 12.8 oz (95.2 kg)   Hyperlipidemia: elevated 6/5   ICH, sees Dr. Lane but most often sees the mid level and she doesn't feel comfortable as they always just advise her to lose weight. She does have a h/o spinal tap. Still having headaches, mainly exacerbated by heat,   She does snore, wakes up with headache in am. Had sleep study about five years ago which she states was negative. She has tried melatonin but ends up with nightmares while taking.   Advanced Directives Patient does not have advanced directive     DEPRESSION SCREENING    01/22/2024   10:41 AM 06/24/2023    9:04 AM 12/30/2022   12:33 PM 11/25/2022    9:58 AM 10/29/2022    1:03 PM 02/14/2022   10:18 AM 10/15/2021    3:56 PM  PHQ 2/9 Scores  PHQ - 2 Score 2 5 3 2 4 3 2   PHQ- 9 Score 5 14 9  11 12 5      ROS: Negative unless specifically indicated above in HPI.    Current Outpatient Medications:    FLUoxetine  (PROZAC ) 40 MG  capsule, Take 1 capsule (40 mg total) by mouth daily., Disp: 90 capsule, Rfl: 3   levonorgestrel  (MIRENA ) 20 MCG/DAY IUD, 1 each by Intrauterine route once., Disp: , Rfl:    ondansetron  (ZOFRAN ) 4 MG tablet, Take 1 tablet (4 mg total) by mouth every 8 (eight) hours as needed for nausea or vomiting., Disp: 20 tablet, Rfl: 0   Semaglutide -Weight Management 2.4 MG/0.75ML SOAJ, Inject 2.4 mg into the skin once a week., Disp: 9 mL, Rfl: 1    Objective:    BP 102/80 (BP Location: Left Arm, Patient Position: Sitting, Cuff Size: Normal)   Pulse 84   Temp 98 F (36.7 C) (Temporal)   Ht 5' 4 (1.626 m)   Wt 203 lb 6.4 oz (92.3 kg)   SpO2 98%   BMI 34.91 kg/m   BP Readings from Last 3 Encounters:  03/23/24 102/80  01/22/24 120/86  09/24/23 128/88      Physical Exam Vitals reviewed.  Constitutional:      General: She is not in acute distress.    Appearance: Normal appearance. She is obese. She is not ill-appearing.  HENT:     Head: Normocephalic.     Right Ear: Tympanic membrane normal.     Left Ear: Tympanic membrane normal.  Nose: Nose normal.     Mouth/Throat:     Mouth: Mucous membranes are moist.  Eyes:     Extraocular Movements: Extraocular movements intact.     Pupils: Pupils are equal, round, and reactive to light.  Cardiovascular:     Rate and Rhythm: Normal rate and regular rhythm.  Pulmonary:     Effort: Pulmonary effort is normal.     Breath sounds: Normal breath sounds.  Abdominal:     General: Abdomen is flat. Bowel sounds are normal.     Palpations: Abdomen is soft.     Tenderness: There is no guarding or rebound.  Musculoskeletal:        General: Normal range of motion.     Cervical back: Normal range of motion.  Skin:    General: Skin is warm.     Capillary Refill: Capillary refill takes less than 2 seconds.  Neurological:     General: No focal deficit present.     Mental Status: She is alert.  Psychiatric:        Mood and Affect: Mood normal.         Behavior: Behavior normal.        Thought Content: Thought content normal.        Judgment: Judgment normal.          Assessment & Plan:  Encounter for general adult medical examination without abnormal findings -     TSH -     CBC -     Comprehensive metabolic panel with GFR  Mixed hyperlipidemia Assessment & Plan: Ordered lipid panel, pending results. Work on low cholesterol diet and exercise as tolerated Lpa ordered pending results.  Could consider statin, pt done with pregnancy, does not want any more kids.   Orders: -     Lipid panel -     Lipoprotein A (LPA)  Iron deficiency anemia due to chronic blood loss -     CBC  Class 1 obesity due to excess calories without serious comorbidity with body mass index (BMI) of 34.0 to 34.9 in adult Assessment & Plan: Doing well with wegovy   Great results.  Continue wegovy  2.4 mg weekly  Pt advised to work on diet and exercise as tolerated    Idiopathic intracranial hypertension Assessment & Plan: F/u with neurology  Still symptomatic   Am headaches/snoring, pt would like to decline sleep study for now    Adult BMI 34.0-34.9 kg/sq m -     TSH  Anxiety and depression Assessment & Plan: Increase prozac  to 40 mg once daily  Caution with trazodone due to ICH however could consider and have pt monitor blood pressure.    Orders: -     FLUoxetine  HCl; Take 1 capsule (40 mg total) by mouth daily.  Dispense: 90 capsule; Refill: 3      Follow-up: Return in about 6 weeks (around 05/04/2024) for f/u anxiety.   Carmen Patrick, FNP

## 2024-03-23 NOTE — Telephone Encounter (Signed)
 Pt with insomnia.  With intracranial hypertension.  Trazodone use caution due to possible hypotension and exacerbation of ICH symptoms, however doxepin says the same. Melatonin causes her nightmares. Any other suggestions?

## 2024-03-23 NOTE — Assessment & Plan Note (Signed)
 Doing well with wegovy   Great results.  Continue wegovy  2.4 mg weekly  Pt advised to work on diet and exercise as tolerated

## 2024-03-26 ENCOUNTER — Ambulatory Visit: Payer: Self-pay | Admitting: Family

## 2024-03-26 DIAGNOSIS — E66811 Obesity, class 1: Secondary | ICD-10-CM

## 2024-03-26 DIAGNOSIS — F419 Anxiety disorder, unspecified: Secondary | ICD-10-CM

## 2024-03-26 DIAGNOSIS — E782 Mixed hyperlipidemia: Secondary | ICD-10-CM

## 2024-03-26 LAB — LIPOPROTEIN A (LPA): Lipoprotein (a): 71 nmol/L (ref <75)

## 2024-03-29 MED ORDER — BELSOMRA 10 MG PO TABS: 10.0000 mg | ORAL_TABLET | Freq: Every evening | ORAL | 1 refills | Status: AC | PRN

## 2024-03-29 MED ORDER — ATORVASTATIN CALCIUM 10 MG PO TABS: 10.0000 mg | ORAL_TABLET | Freq: Every day | ORAL | 3 refills | Status: AC

## 2024-03-29 NOTE — Addendum Note (Signed)
 Addended by: CORWIN ANTU on: 03/29/2024 06:59 AM   Modules accepted: Orders

## 2024-03-31 ENCOUNTER — Other Ambulatory Visit (HOSPITAL_COMMUNITY): Payer: Self-pay

## 2024-03-31 ENCOUNTER — Telehealth: Payer: Self-pay

## 2024-03-31 NOTE — Telephone Encounter (Signed)
 Pharmacy Patient Advocate Encounter   Received notification from Onbase that prior authorization for Belsomra  10MG  tablets  is required/requested.   Insurance verification completed.   The patient is insured through [facility] .   Per test claim: PA required; PA submitted to above mentioned insurance via Latent Key/confirmation #/EOC [id number] Status is pending

## 2024-03-31 NOTE — Telephone Encounter (Signed)
 Pharmacy Patient Advocate Encounter  Received notification from [facility] that Prior Authorization for Belsomra  10MG  tablets  has been APPROVED from 03/31/24 to 08/18/24. Ran test claim, Copay is $4. This test claim was processed through [facility]- copay amounts may vary at other pharmacies due to pharmacy/plan contracts, or as the patient moves through the different stages of their insurance plan.   PA #/Case ID/Reference #: [id number]

## 2024-03-31 NOTE — Telephone Encounter (Unsigned)
 Copied from CRM [id number]. Topic: Clinical - Prescription Issue >> Mar 31, 2024  1:59 PM Donna BRAVO wrote: Reason for CRM: Carmen Reid   [phone]  [facility] prior authroization  Case # [id number]   Carmen Reid has clinical questions regarding  Belsomra  10MG  tablets and is asking to speak with a nurse.

## 2024-04-05 ENCOUNTER — Other Ambulatory Visit (HOSPITAL_COMMUNITY): Payer: Self-pay

## 2024-04-23 ENCOUNTER — Telehealth: Payer: Self-pay | Admitting: Family

## 2024-04-23 NOTE — Telephone Encounter (Signed)
 Type of forms received: DOT   Routed un:[place] Pool   Paperwork received by :Randall     Individual made aware of 3-5 business day turn around (Y/N): Y   Form completed and patient made aware of charges(Y/N):Y    Form location:  In provider's upfront box

## 2024-04-23 NOTE — Telephone Encounter (Signed)
Forms placed in Tabitha's inbox.

## 2024-04-23 NOTE — Telephone Encounter (Signed)
 Needs appt unfortunately  Because we need to document for the DOT

## 2024-04-23 NOTE — Telephone Encounter (Signed)

## 2024-04-29 ENCOUNTER — Ambulatory Visit: Admitting: Family

## 2024-04-29 ENCOUNTER — Encounter: Payer: Self-pay | Admitting: Family

## 2024-04-29 DIAGNOSIS — Z6833 Body mass index (BMI) 33.0-33.9, adult: Secondary | ICD-10-CM

## 2024-04-29 DIAGNOSIS — F32A Depression, unspecified: Secondary | ICD-10-CM

## 2024-04-29 DIAGNOSIS — E66811 Obesity, class 1: Secondary | ICD-10-CM

## 2024-04-29 DIAGNOSIS — F419 Anxiety disorder, unspecified: Secondary | ICD-10-CM | POA: Diagnosis not present

## 2024-04-29 DIAGNOSIS — E6609 Other obesity due to excess calories: Secondary | ICD-10-CM

## 2024-04-29 DIAGNOSIS — R110 Nausea: Secondary | ICD-10-CM

## 2024-04-29 MED ORDER — ONDANSETRON HCL 4 MG PO TABS: 4.0000 mg | ORAL_TABLET | Freq: Three times a day (TID) | ORAL | 0 refills | Status: AC | PRN

## 2024-04-29 NOTE — Progress Notes (Signed)
 Established Patient Office Visit  Subjective:      CC:  Chief Complaint  Patient presents with   Medical Management of Chronic Issues    HPI: Carmen Reid is a 28 y.o. female presenting on 04/29/2024 for Medical Management of Chronic Issues .  Discussed the use of AI scribe software for clinical note transcription with the patient, who gave verbal consent to proceed.  History of Present Illness Carmen Reid is a 28 year old female with anxiety and depression who presents for medication management and follow-up.  She is currently taking Prozac  40 mg once daily, which effectively manages her depression and anxiety. Her depression is well-controlled, and she feels 'great'. She has previously tried Celexa , which also did not alleviate her anxiety.   Her anxiety does not affect her ability to concentrate or focus on a daily basis.No fatigue or nausea from her current medication regimen.  She sleeps about six to seven hours per night.   She is also on Wegovy  2.4 mg once a week for weight loss and has lost a total of 36 pounds since starting the medication. She reports no side effects such as fatigue or dips in blood sugar, and her A1c is 5.0.  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 Her mother works at the same company.          Social history:  Relevant past medical, surgical, family and social history reviewed and updated as indicated. Interim medical history since our last visit reviewed.  Allergies and medications reviewed and updated.  DATA REVIEWED: CHART IN EPIC     ROS: Negative unless specifically indicated above in HPI.    Current Outpatient Medications:    atorvastatin  (LIPITOR) 10 MG tablet, Take 1 tablet (10 mg total) by mouth daily., Disp: 90 tablet, Rfl: 3   FLUoxetine  (PROZAC ) 40 MG capsule, Take 1 capsule (40 mg total) by mouth daily., Disp: 90  capsule, Rfl: 3   levonorgestrel  (MIRENA ) 20 MCG/DAY IUD, 1 each by Intrauterine route once., Disp: , Rfl:    ondansetron  (ZOFRAN ) 4 MG tablet, Take 1 tablet (4 mg total) by mouth every 8 (eight) hours as needed for nausea or vomiting., Disp: 20 tablet, Rfl: 0   Semaglutide -Weight Management 2.4 MG/0.75ML SOAJ, Inject 2.4 mg into the skin once a week., Disp: 9 mL, Rfl: 1   Suvorexant  (BELSOMRA ) 10 MG TABS, Take 1 tablet (10 mg total) by mouth at bedtime as needed., Disp: 30 tablet, Rfl: 1        Objective:        BP 122/80 (BP Location: Right Arm, Patient Position: Sitting, Cuff Size: Large)   Pulse 64   Temp 98.2 F (36.8 C) (Temporal)   Ht 5' 4 (1.626 m)   Wt 196 lb 6.4 oz (89.1 kg)   SpO2 98%   BMI 33.71 kg/m   Physical Exam MEASUREMENTS: Weight- 196.  Wt Readings from Last 3 Encounters:  04/29/24 196 lb 6.4 oz (89.1 kg)  03/23/24 203 lb 6.4 oz (92.3 kg)  01/22/24 200 lb (90.7 kg)    Physical Exam Vitals reviewed.  Constitutional:      General: She is not in acute distress.    Appearance: Normal appearance. She is normal weight. She is not ill-appearing, toxic-appearing or diaphoretic.  HENT:     Head: Normocephalic.  Cardiovascular:     Rate and Rhythm: Normal rate.  Pulmonary:     Effort: Pulmonary effort is normal.  Musculoskeletal:        General: Normal range of motion.  Neurological:     General: No focal deficit present.     Mental Status: She is alert and oriented to person, place, and time. Mental status is at baseline.  Psychiatric:        Mood and Affect: Mood normal.        Behavior: Behavior normal.        Thought Content: Thought content normal.        Judgment: Judgment normal.     Wt Readings from Last 3 Encounters:  04/29/24 196 lb 6.4 oz (89.1 kg)  03/23/24 203 lb 6.4 oz (92.3 kg)  01/22/24 200 lb (90.7 kg)         Results LABS   A1c: 5.0%  Assessment & Plan:   Assessment and Plan Assessment & Plan Anxiety disorder and  major depressive disorder -overall controlled on prozac  40 mg once daily  -do not see any reason there would be any limitations with DOT /operating CMV  Class 1 obesity due to excess calories On semaglutide  2.4 mg weekly for weight management, resulting in a 36-pound weight loss. A1c is 5.0, indicating non-diabetic status. No adverse effects from semaglutide , such as fatigue or hypoglycemia. No restrictions affecting the ability to operate a CMV. - Continue semaglutide  2.4 mg subcutaneously once a week for weight management.  Recording duration: 9 minutes      Return if symptoms worsen or fail to improve.     [name], MSN, APRN, FNP-C [facility]

## 2024-05-03 ENCOUNTER — Other Ambulatory Visit: Payer: Self-pay | Admitting: Family

## 2024-05-03 DIAGNOSIS — G932 Benign intracranial hypertension: Secondary | ICD-10-CM

## 2024-05-03 DIAGNOSIS — E785 Hyperlipidemia, unspecified: Secondary | ICD-10-CM

## 2024-05-03 DIAGNOSIS — Z6841 Body Mass Index (BMI) 40.0 and over, adult: Secondary | ICD-10-CM

## 2024-05-04 ENCOUNTER — Telehealth: Admitting: Family

## 2024-05-18 ENCOUNTER — Encounter: Payer: Self-pay | Admitting: Family

## 2024-06-16 ENCOUNTER — Other Ambulatory Visit: Payer: Self-pay | Admitting: Family

## 2024-06-16 DIAGNOSIS — Z6841 Body Mass Index (BMI) 40.0 and over, adult: Secondary | ICD-10-CM

## 2024-06-16 DIAGNOSIS — G932 Benign intracranial hypertension: Secondary | ICD-10-CM

## 2024-06-16 DIAGNOSIS — E785 Hyperlipidemia, unspecified: Secondary | ICD-10-CM

## 2024-06-16 DIAGNOSIS — E66813 Obesity, class 3: Secondary | ICD-10-CM

## 2024-06-17 ENCOUNTER — Other Ambulatory Visit: Payer: Self-pay | Admitting: Family

## 2024-06-17 DIAGNOSIS — E785 Hyperlipidemia, unspecified: Secondary | ICD-10-CM

## 2024-06-17 DIAGNOSIS — E66813 Obesity, class 3: Secondary | ICD-10-CM

## 2024-06-17 DIAGNOSIS — Z6841 Body Mass Index (BMI) 40.0 and over, adult: Secondary | ICD-10-CM

## 2024-06-17 DIAGNOSIS — G932 Benign intracranial hypertension: Secondary | ICD-10-CM

## 2024-06-18 ENCOUNTER — Other Ambulatory Visit: Payer: Self-pay | Admitting: Family

## 2024-06-18 DIAGNOSIS — G932 Benign intracranial hypertension: Secondary | ICD-10-CM

## 2024-06-18 DIAGNOSIS — E785 Hyperlipidemia, unspecified: Secondary | ICD-10-CM

## 2024-06-18 DIAGNOSIS — Z6841 Body Mass Index (BMI) 40.0 and over, adult: Secondary | ICD-10-CM

## 2024-08-26 ENCOUNTER — Encounter: Payer: Self-pay | Admitting: Family

## 2024-08-26 DIAGNOSIS — E6609 Other obesity due to excess calories: Secondary | ICD-10-CM

## 2024-08-26 DIAGNOSIS — E782 Mixed hyperlipidemia: Secondary | ICD-10-CM

## 2024-08-26 DIAGNOSIS — G932 Benign intracranial hypertension: Secondary | ICD-10-CM

## 2024-08-26 DIAGNOSIS — E282 Polycystic ovarian syndrome: Secondary | ICD-10-CM

## 2024-08-27 MED ORDER — PHENTERMINE HCL 8 MG PO TABS: 8.0000 mg | ORAL_TABLET | Freq: Every day | ORAL | 2 refills | Status: AC

## 2024-08-31 NOTE — Telephone Encounter (Unsigned)
 Copied from CRM [id number]. Topic: General - Other >> Aug 31, 2024  9:20 AM Jeoffrey H wrote: Reason for CRM: Lauraine calling from the [facility] stated patient was seen and she is clear from a Neuro standpoint to take Phentermine  for wt loss. She has to be pcd this medication by her pcp.

## 2024-08-31 NOTE — Telephone Encounter (Signed)
 Sounds good I already sent it 1/9

## 2024-09-02 ENCOUNTER — Encounter: Payer: Self-pay | Admitting: Family
